# Patient Record
Sex: Female | Born: 1935 | Race: White | Hispanic: No | Smoking: Former smoker
Health system: Southern US, Community
[De-identification: ages and names within clinical notes are randomized; demographics above are authoritative.]

## PROBLEM LIST (undated history)

## (undated) DIAGNOSIS — I10 Essential (primary) hypertension: Secondary | ICD-10-CM

## (undated) DIAGNOSIS — I509 Heart failure, unspecified: Secondary | ICD-10-CM

## (undated) DIAGNOSIS — I4891 Unspecified atrial fibrillation: Secondary | ICD-10-CM

## (undated) DIAGNOSIS — J449 Chronic obstructive pulmonary disease, unspecified: Secondary | ICD-10-CM

## (undated) DIAGNOSIS — C4330 Malignant melanoma of unspecified part of face: Secondary | ICD-10-CM

## (undated) DIAGNOSIS — I639 Cerebral infarction, unspecified: Secondary | ICD-10-CM

## (undated) DIAGNOSIS — R0602 Shortness of breath: Secondary | ICD-10-CM

## (undated) HISTORY — PX: SKIN CANCER EXCISION: SHX779

## (undated) HISTORY — PX: TUBAL LIGATION: SHX77

## (undated) HISTORY — PX: CATARACT EXTRACTION: SUR2

---

## 2004-05-20 ENCOUNTER — Other Ambulatory Visit: Payer: Self-pay

## 2004-11-19 ENCOUNTER — Ambulatory Visit: Payer: Self-pay | Admitting: Oncology

## 2005-06-10 ENCOUNTER — Ambulatory Visit: Payer: Self-pay | Admitting: Oncology

## 2005-06-18 ENCOUNTER — Ambulatory Visit: Payer: Self-pay | Admitting: Oncology

## 2005-07-21 ENCOUNTER — Ambulatory Visit: Payer: Self-pay | Admitting: Internal Medicine

## 2005-12-22 ENCOUNTER — Ambulatory Visit: Payer: Self-pay | Admitting: Oncology

## 2006-05-05 ENCOUNTER — Ambulatory Visit: Payer: Self-pay | Admitting: Family Medicine

## 2006-05-13 ENCOUNTER — Ambulatory Visit: Payer: Self-pay | Admitting: Oncology

## 2006-05-18 ENCOUNTER — Ambulatory Visit: Payer: Self-pay | Admitting: Oncology

## 2006-05-25 ENCOUNTER — Ambulatory Visit: Payer: Self-pay | Admitting: Family Medicine

## 2006-11-14 ENCOUNTER — Ambulatory Visit: Payer: Self-pay | Admitting: Oncology

## 2006-12-13 ENCOUNTER — Ambulatory Visit: Payer: Self-pay | Admitting: Oncology

## 2007-01-08 ENCOUNTER — Emergency Department: Payer: Self-pay | Admitting: Internal Medicine

## 2007-01-20 ENCOUNTER — Other Ambulatory Visit: Payer: Self-pay

## 2007-01-20 ENCOUNTER — Inpatient Hospital Stay: Payer: Self-pay | Admitting: Internal Medicine

## 2007-05-14 ENCOUNTER — Ambulatory Visit: Payer: Self-pay | Admitting: Oncology

## 2007-05-17 ENCOUNTER — Ambulatory Visit: Payer: Self-pay | Admitting: Oncology

## 2007-06-13 ENCOUNTER — Ambulatory Visit: Payer: Self-pay | Admitting: Oncology

## 2007-11-13 ENCOUNTER — Ambulatory Visit: Payer: Self-pay | Admitting: Oncology

## 2007-11-15 ENCOUNTER — Ambulatory Visit: Payer: Self-pay | Admitting: Oncology

## 2007-12-14 ENCOUNTER — Ambulatory Visit: Payer: Self-pay | Admitting: Oncology

## 2008-02-02 ENCOUNTER — Ambulatory Visit: Payer: Self-pay | Admitting: Oncology

## 2008-02-25 ENCOUNTER — Emergency Department: Payer: Self-pay | Admitting: Internal Medicine

## 2008-05-13 ENCOUNTER — Ambulatory Visit: Payer: Self-pay | Admitting: Oncology

## 2008-05-15 ENCOUNTER — Inpatient Hospital Stay: Payer: Self-pay | Admitting: Internal Medicine

## 2008-05-15 ENCOUNTER — Other Ambulatory Visit: Payer: Self-pay

## 2008-05-24 ENCOUNTER — Other Ambulatory Visit: Payer: Self-pay

## 2008-06-10 ENCOUNTER — Encounter: Payer: Self-pay | Admitting: Internal Medicine

## 2008-06-12 ENCOUNTER — Ambulatory Visit: Payer: Self-pay | Admitting: Oncology

## 2008-06-12 ENCOUNTER — Encounter: Payer: Self-pay | Admitting: Internal Medicine

## 2008-07-10 ENCOUNTER — Ambulatory Visit: Payer: Self-pay | Admitting: Oncology

## 2008-07-13 ENCOUNTER — Encounter: Payer: Self-pay | Admitting: Internal Medicine

## 2008-07-13 ENCOUNTER — Ambulatory Visit: Payer: Self-pay | Admitting: Oncology

## 2008-08-20 ENCOUNTER — Ambulatory Visit: Payer: Self-pay | Admitting: Oncology

## 2008-08-23 ENCOUNTER — Ambulatory Visit: Payer: Self-pay | Admitting: Oncology

## 2008-09-12 ENCOUNTER — Ambulatory Visit: Payer: Self-pay | Admitting: Oncology

## 2008-09-24 ENCOUNTER — Ambulatory Visit: Payer: Self-pay | Admitting: Family Medicine

## 2008-12-13 ENCOUNTER — Ambulatory Visit: Payer: Self-pay | Admitting: Oncology

## 2008-12-23 ENCOUNTER — Ambulatory Visit: Payer: Self-pay | Admitting: Oncology

## 2008-12-27 ENCOUNTER — Ambulatory Visit: Payer: Self-pay | Admitting: Oncology

## 2009-01-13 ENCOUNTER — Ambulatory Visit: Payer: Self-pay | Admitting: Oncology

## 2009-02-21 ENCOUNTER — Ambulatory Visit: Payer: Self-pay | Admitting: Family Medicine

## 2009-05-29 IMAGING — CT CT CHEST W/ CM
2 series · 15 of 31 positions shown, 19 images · IV contrast (agent unspecified)
Comparison: none

REASON FOR EXAM: INCLUDE ADRENALS    HX of lung CA
COMMENTS:

PROCEDURE:     CT  - CT CHEST WITH CONTRAST  - December 23, 2008 [DATE]
RESULT:     Comparison: 08/20/2008
INDICATION: History of lung cancer
TECHNIQUE: Multiple axial images of the chest are obtained with 100 ml of
Gsovue-I92 intravenous contrast.

[Series 2: soft tissue · axial · 0.68mm/px · z∈[-110,-50]mm · 2 of 75 slices shown]
[im 6/75  mediastinal]
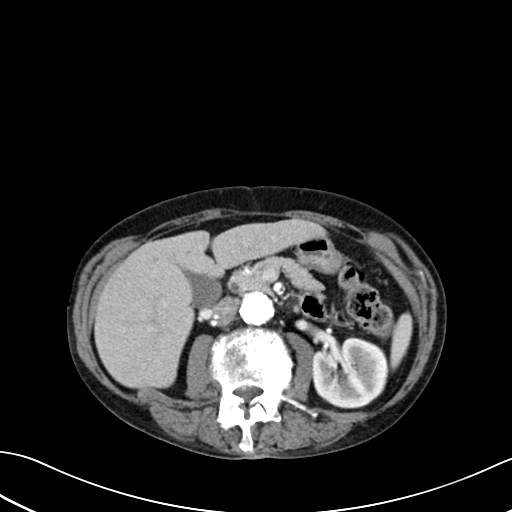
[im 18/75  mediastinal]
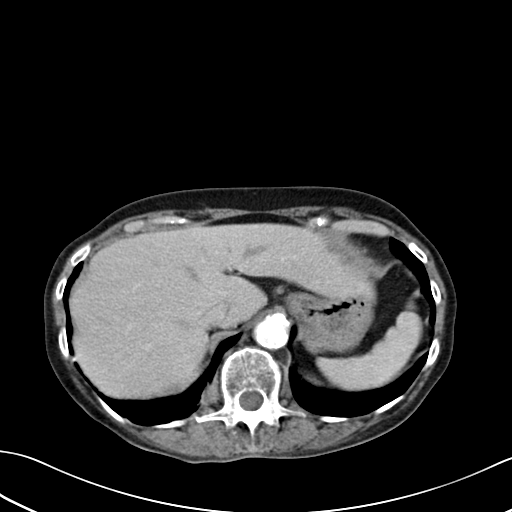

[Series 3: lung windows · axial · 0.68mm/px · z∈[-100,+204]mm · 13 of 73 slices shown, 17 images]
[im 6/73  mediastinal]
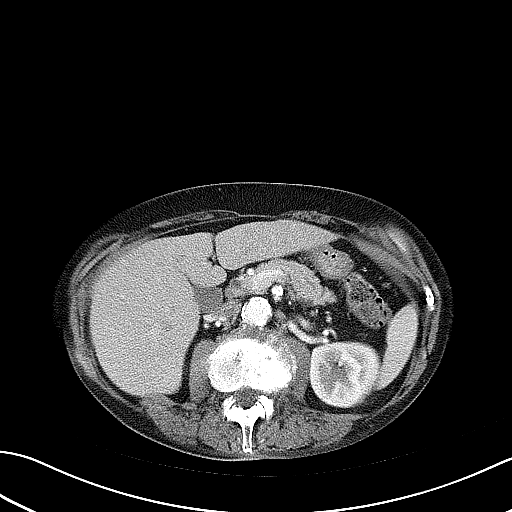
[im 6/73  lung]
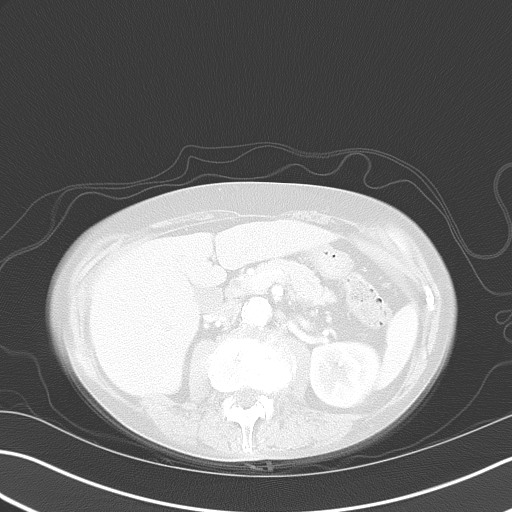
[im 12/73  lung]
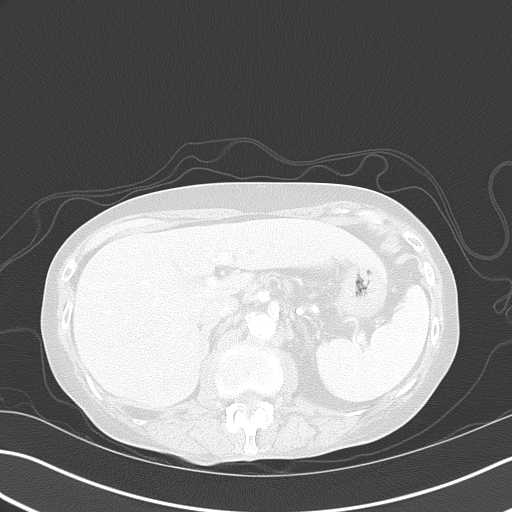
[im 17/73  lung]
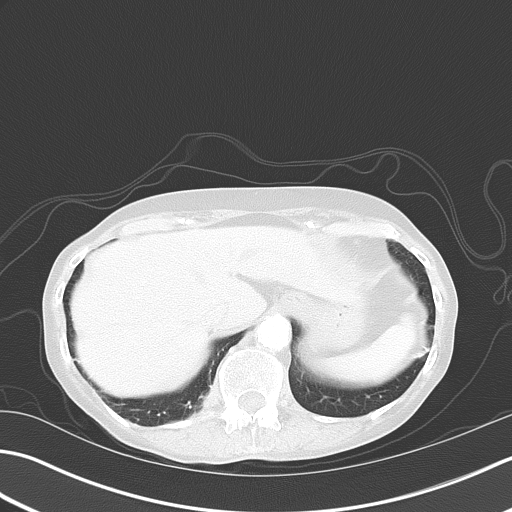
[im 23/73  lung]
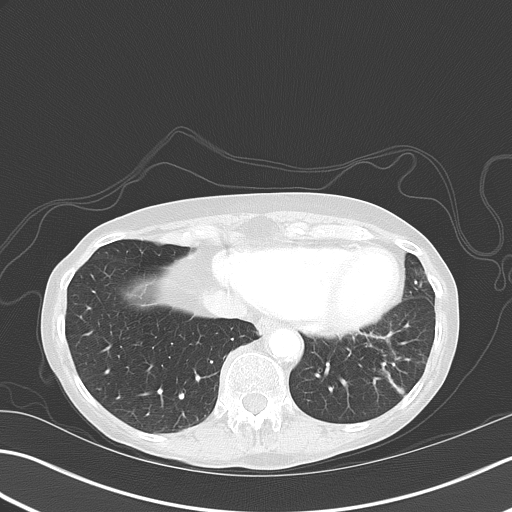
[im 28/73  mediastinal]
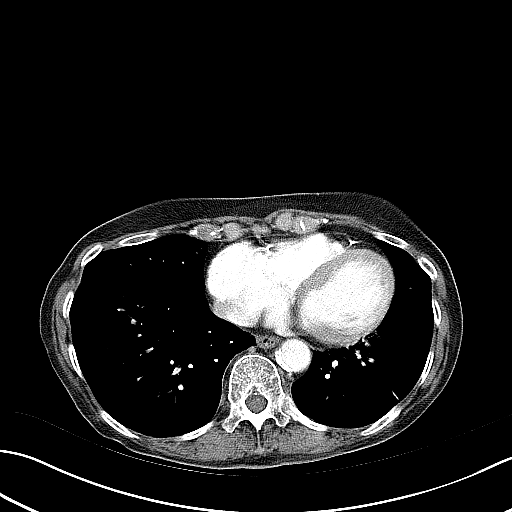
[im 28/73  lung]
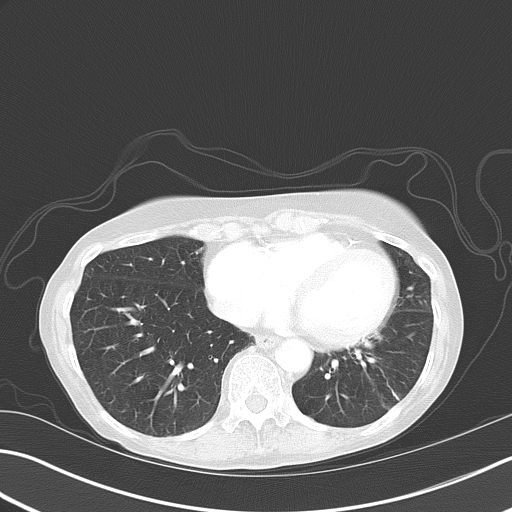
[im 34/73  lung]
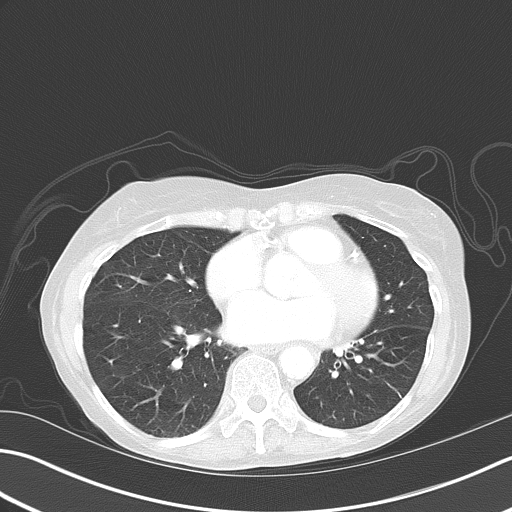
[im 37/73  lung]
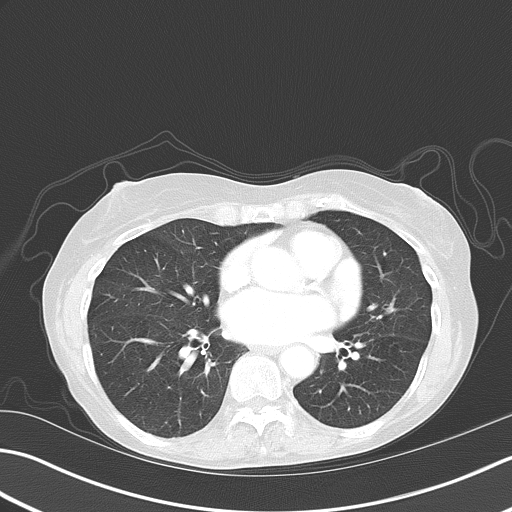
[im 39/73  lung]
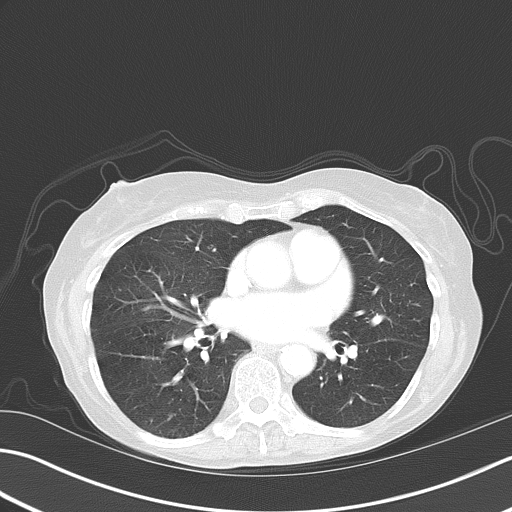
[im 45/73  mediastinal]
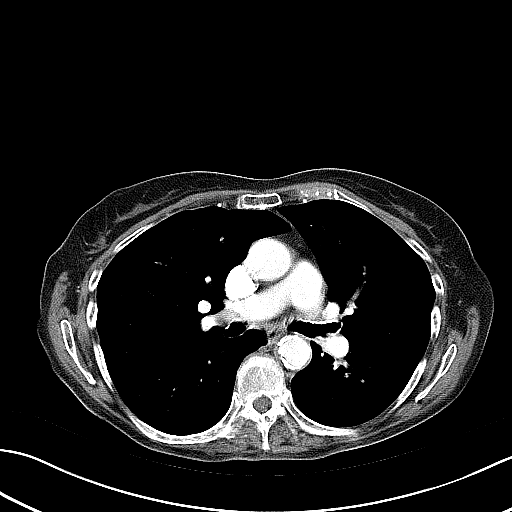
[im 45/73  lung]
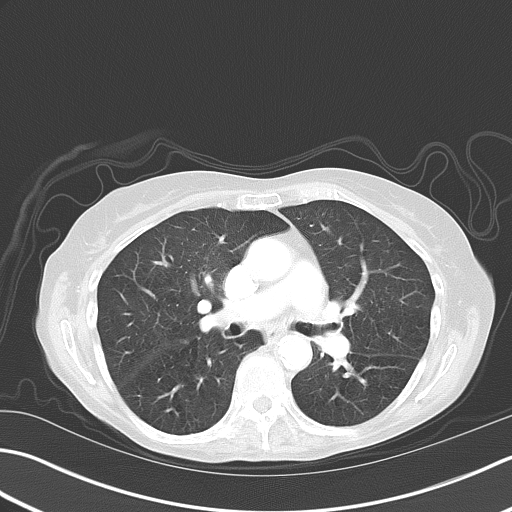
[im 50/73  lung]
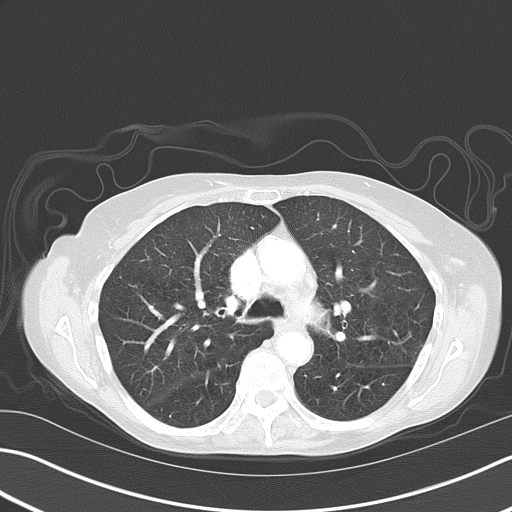
[im 56/73  lung]
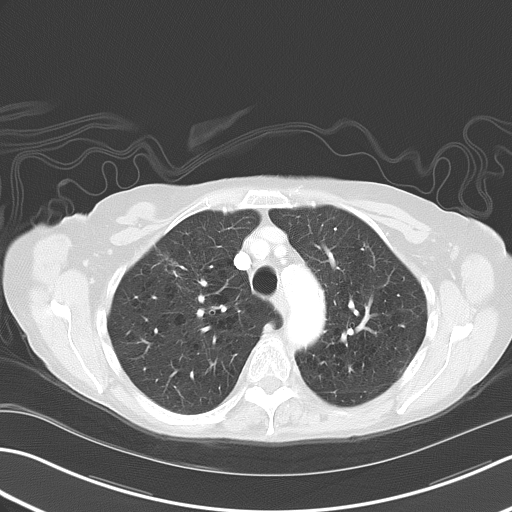
[im 61/73  lung]
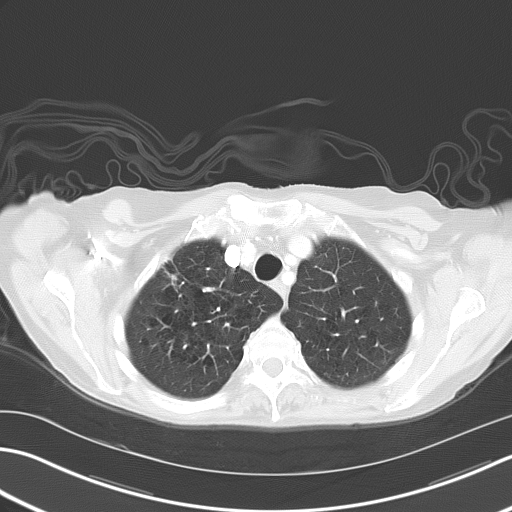
[im 67/73  mediastinal]
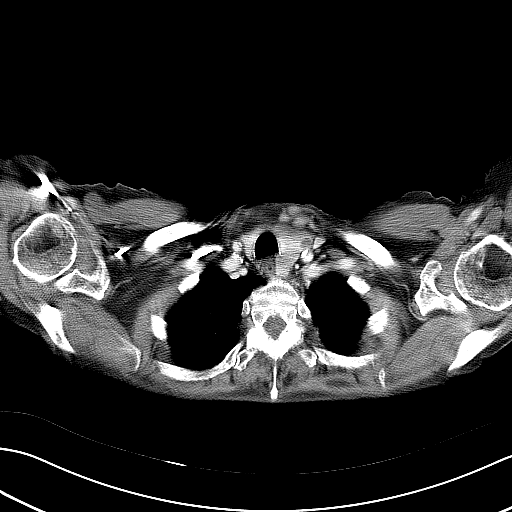
[im 67/73  lung]
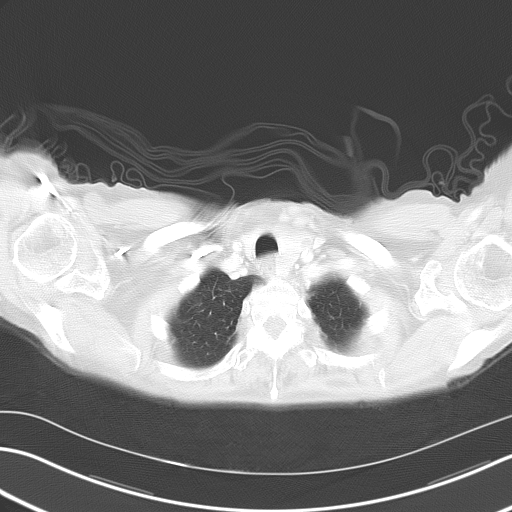

[15 of 31 positions shown; findings below may reference images not displayed]

FINDINGS: The central airways are patent. Again demonstrated is a linear density in
the right upper lobe most consistent with fibrosis. This is unchanged
compared to the prior exam. There is no focal pulmonary mass or nodule.
There is a stable irregular linear opacity measuring less than 3 mm in the
left upper lobe. There is linear airspace disease at the left lung base like
to presenting scarring. There are bilateral emphysematous changes
predominantly in the upper lobes. There is no pleural effusion or
pneumothorax.

There are no pathologically enlarged axillary, hilar, or mediastinal lymph
nodes.

The heart size is normal. There is no pericardial effusion. The thoracic
aorta is normal in caliber.  Coronary artery calcifications are noted..

Review of bone windows demonstrates no focal lytic or sclerotic lesions.

Limited noncontrast images of the upper abdomen were obtained. There is a
1.2 x 1.3 cm left adrenal nodule which appears unchanged from the prior exam.
IMPRESSION: 1. No evidence of a discrete pulmonary mass.

2. Stable left adrenal nodule.

## 2009-10-13 ENCOUNTER — Ambulatory Visit: Payer: Self-pay | Admitting: Oncology

## 2009-10-15 ENCOUNTER — Ambulatory Visit: Payer: Self-pay | Admitting: Oncology

## 2009-11-05 ENCOUNTER — Ambulatory Visit: Payer: Self-pay | Admitting: Family Medicine

## 2009-11-12 ENCOUNTER — Ambulatory Visit: Payer: Self-pay | Admitting: Oncology

## 2010-12-02 ENCOUNTER — Ambulatory Visit: Payer: Self-pay | Admitting: Family Medicine

## 2011-04-19 ENCOUNTER — Emergency Department: Payer: Self-pay | Admitting: Emergency Medicine

## 2011-05-13 ENCOUNTER — Inpatient Hospital Stay: Payer: Self-pay | Admitting: Internal Medicine

## 2011-06-17 ENCOUNTER — Ambulatory Visit: Payer: Self-pay | Admitting: Vascular Surgery

## 2011-07-08 ENCOUNTER — Ambulatory Visit: Payer: Self-pay | Admitting: Internal Medicine

## 2011-07-23 ENCOUNTER — Inpatient Hospital Stay: Payer: Self-pay | Admitting: Vascular Surgery

## 2011-10-17 IMAGING — US US CAROTID DUPLEX BILAT
1 series · 14 of 24 positions shown · non-contrast
Comparison: none

REASON FOR EXAM: COMMENTS:

[Series 1: us carotid duplex bilat · 0.06mm/px · 14 of 59 slices shown]
[im 1/59]
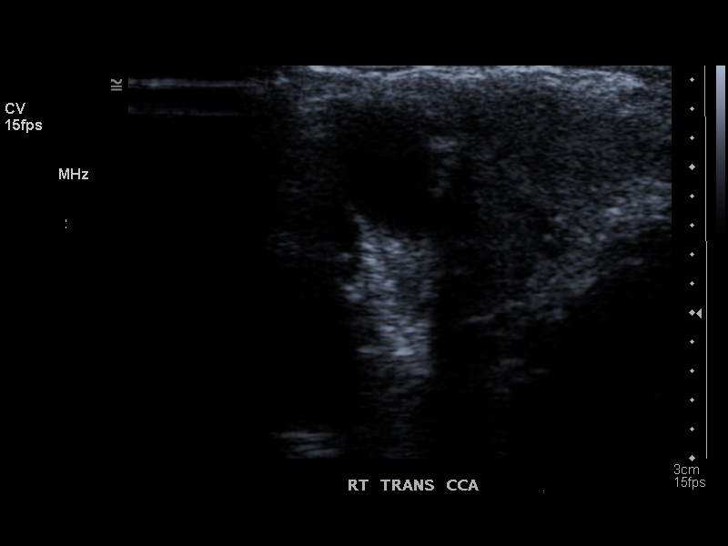
[im 6/59]
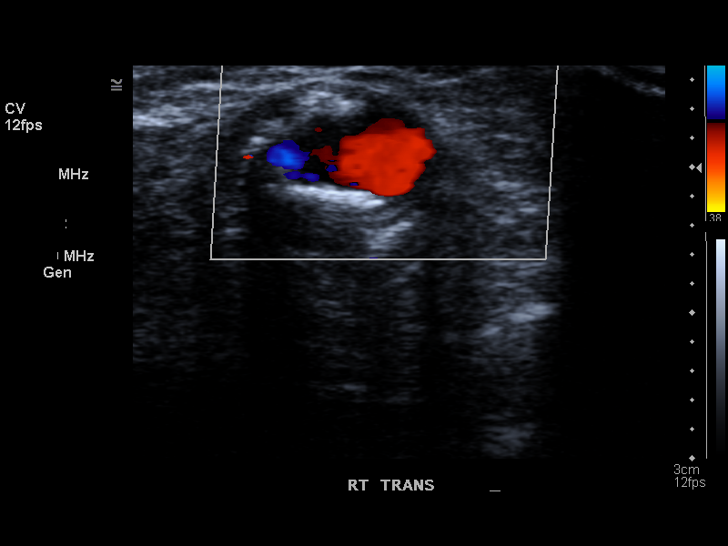
[im 11/59]
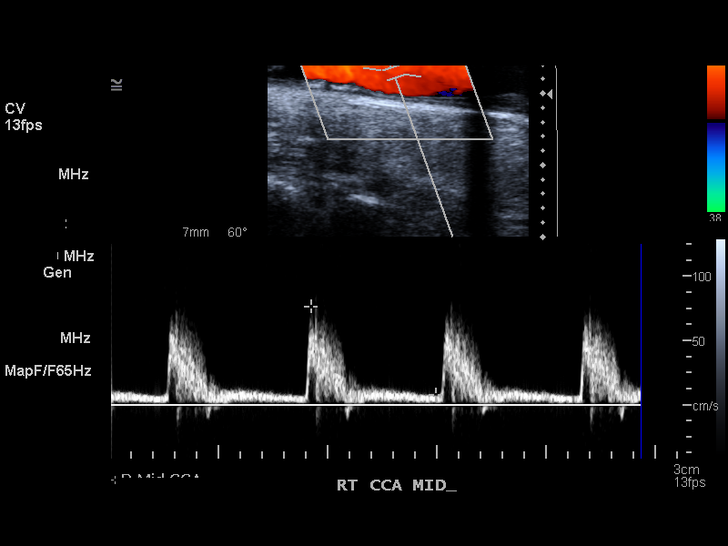
[im 16/59]
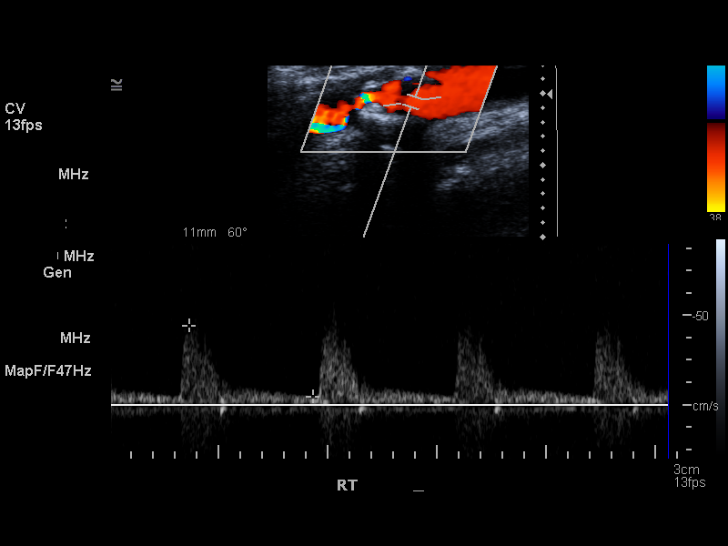
[im 18/59]
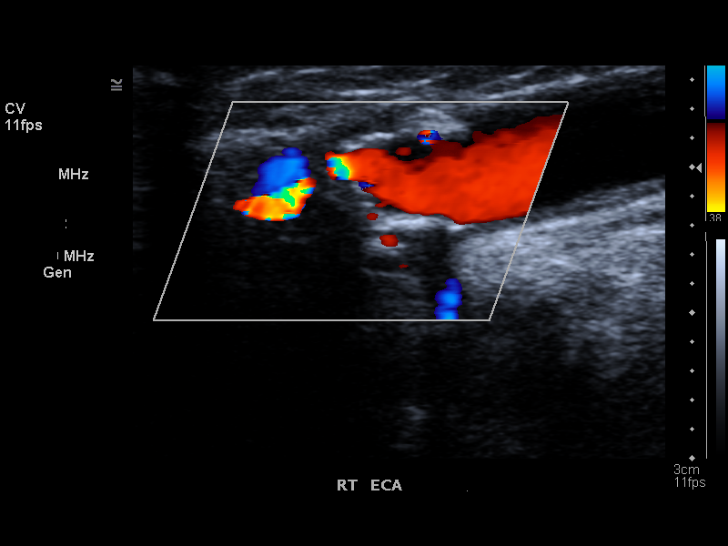
[im 23/59]
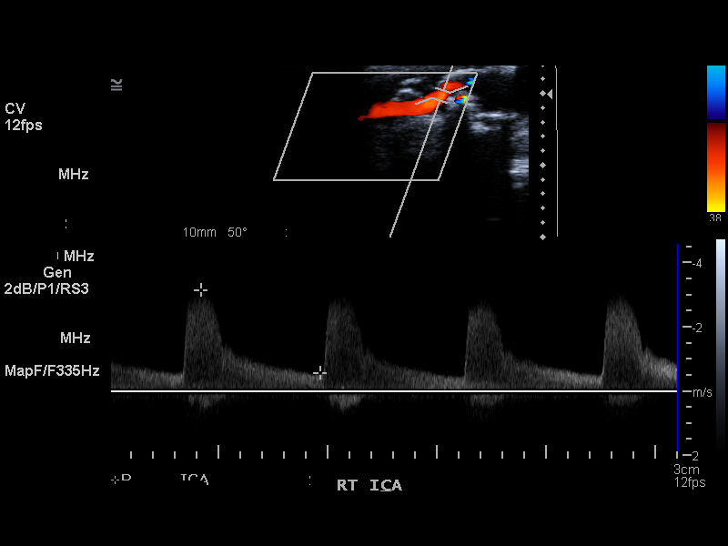
[im 28/59]
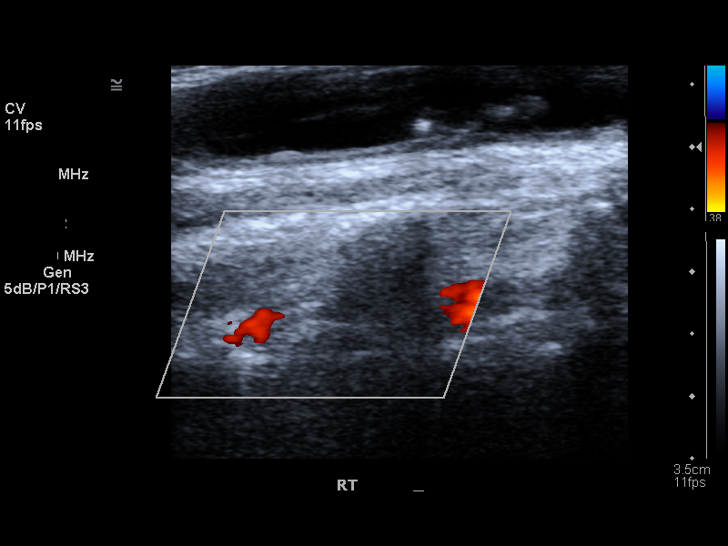
[im 31/59]
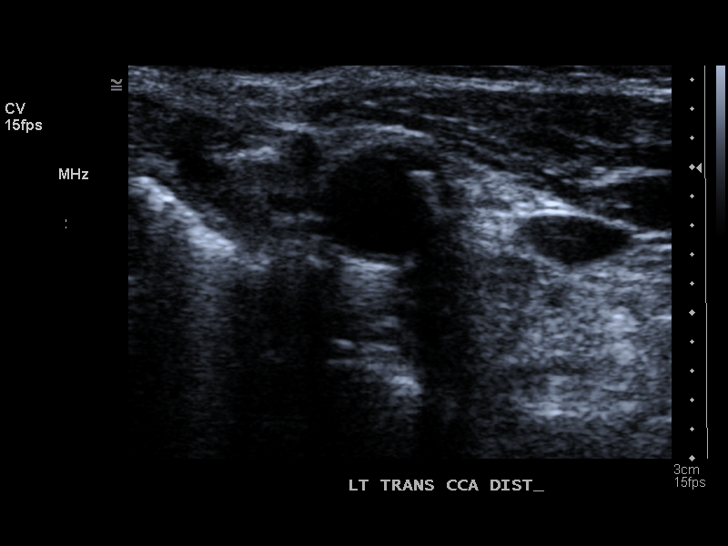
[im 36/59]
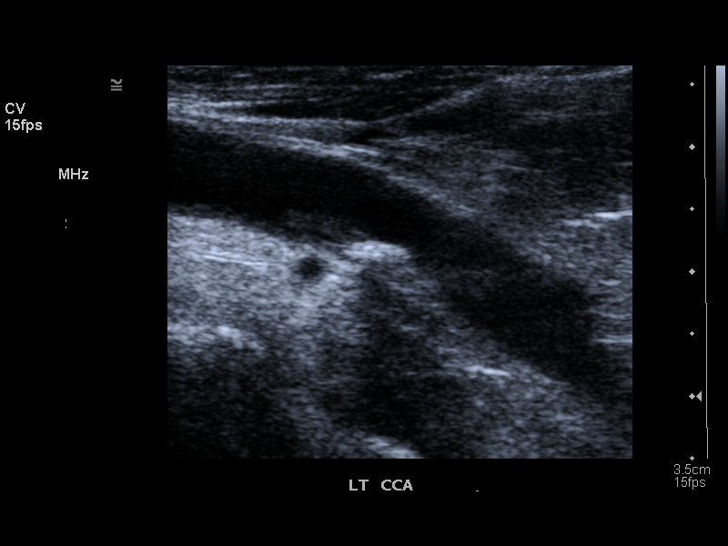
[im 41/59]
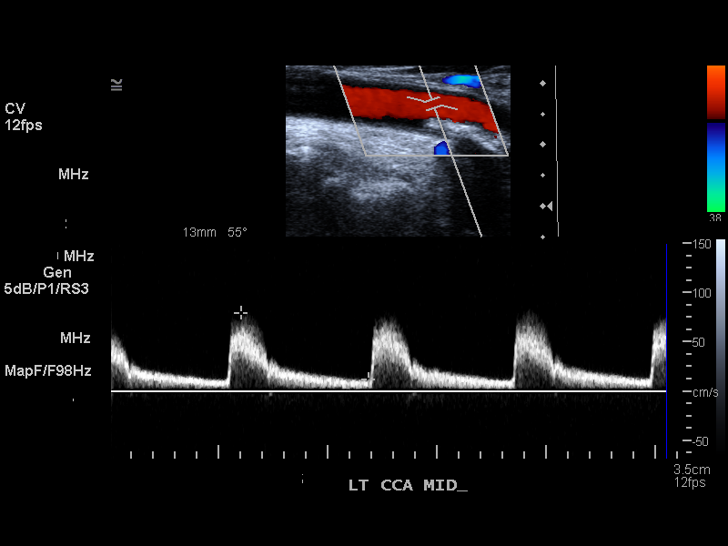
[im 46/59]
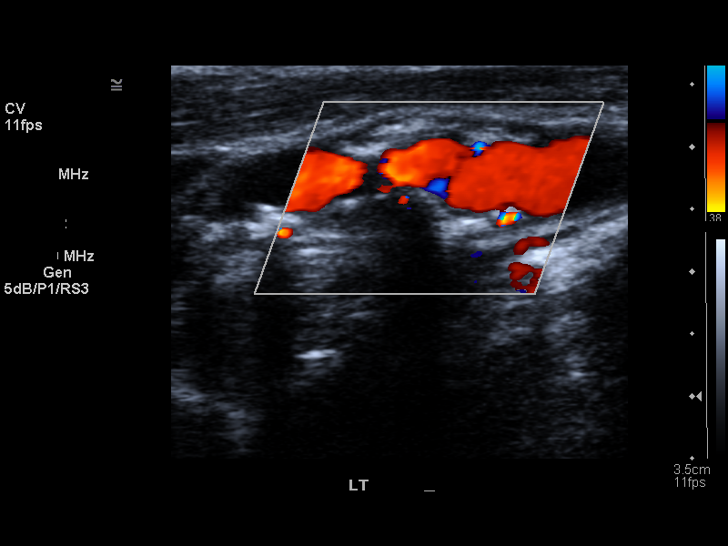
[im 48/59]
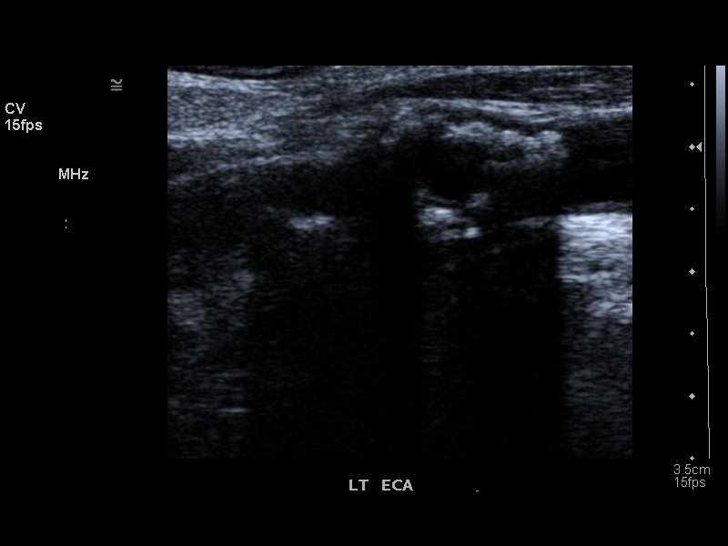
[im 53/59]
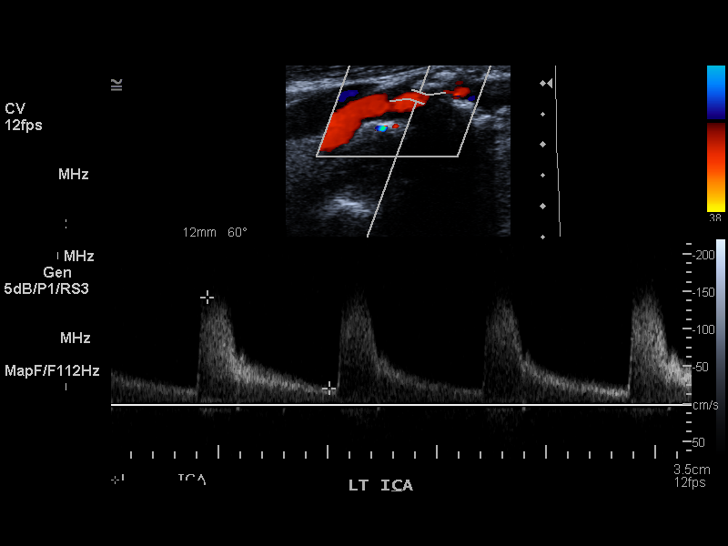
[im 59/59]
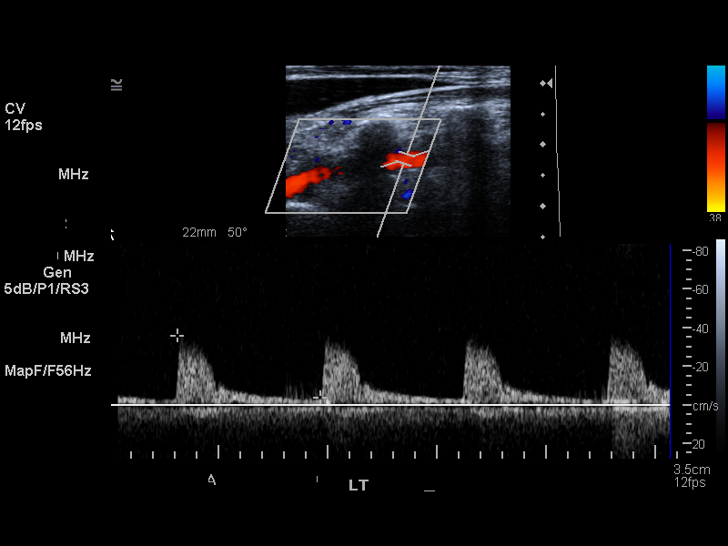

[14 of 24 positions shown; findings below may reference images not displayed]

PROCEDURE:     US  - US CAROTID DOPPLER BILATERAL  - May 13, 2011 [DATE]

RESULT:      Standard bilateral carotid color duplex Doppler reveals
bilateral dense calcific stenoses with peak systolic flow velocity ratio on
the right with degree of stenosis up to 95% and peak systolic flow velocity
ratio on the left of 1.8 with degree of stenosis of 50 to 75%.
IMPRESSION: Severe bilateral carotid disease with near occlusion on the right. CTA
should be considered for further evaluation.

Addendum: Vertebrals are patent with antegrade flow.

## 2012-10-18 ENCOUNTER — Ambulatory Visit: Payer: Self-pay | Admitting: Ophthalmology

## 2012-10-18 DIAGNOSIS — I10 Essential (primary) hypertension: Secondary | ICD-10-CM

## 2012-10-30 ENCOUNTER — Ambulatory Visit: Payer: Self-pay | Admitting: Ophthalmology

## 2012-11-22 ENCOUNTER — Ambulatory Visit: Payer: Self-pay | Admitting: Ophthalmology

## 2012-11-22 LAB — POTASSIUM: Potassium: 4.1 mmol/L (ref 3.5–5.1)

## 2012-11-28 ENCOUNTER — Ambulatory Visit: Payer: Self-pay | Admitting: Ophthalmology

## 2013-05-04 ENCOUNTER — Ambulatory Visit: Payer: Self-pay | Admitting: Surgery

## 2013-05-04 LAB — PROTIME-INR
INR: 1.9
Prothrombin Time: 21.2 secs — ABNORMAL HIGH (ref 11.5–14.7)

## 2013-05-08 LAB — WOUND CULTURE

## 2013-05-09 ENCOUNTER — Encounter: Payer: Self-pay | Admitting: Cardiothoracic Surgery

## 2013-05-09 ENCOUNTER — Encounter: Payer: Self-pay | Admitting: Nurse Practitioner

## 2013-05-13 ENCOUNTER — Encounter: Payer: Self-pay | Admitting: Cardiothoracic Surgery

## 2013-05-13 ENCOUNTER — Encounter: Payer: Self-pay | Admitting: Nurse Practitioner

## 2013-06-12 ENCOUNTER — Encounter: Payer: Self-pay | Admitting: Nurse Practitioner

## 2013-06-12 ENCOUNTER — Encounter: Payer: Self-pay | Admitting: Cardiothoracic Surgery

## 2013-07-13 ENCOUNTER — Encounter: Payer: Self-pay | Admitting: Cardiothoracic Surgery

## 2013-07-13 ENCOUNTER — Encounter: Payer: Self-pay | Admitting: Nurse Practitioner

## 2013-11-29 ENCOUNTER — Inpatient Hospital Stay: Payer: Self-pay | Admitting: Specialist

## 2013-11-29 LAB — COMPREHENSIVE METABOLIC PANEL
Alkaline Phosphatase: 82 U/L
Anion Gap: 6 — ABNORMAL LOW (ref 7–16)
BUN: 18 mg/dL (ref 7–18)
Bilirubin,Total: 0.5 mg/dL (ref 0.2–1.0)
Calcium, Total: 8.3 mg/dL — ABNORMAL LOW (ref 8.5–10.1)
Creatinine: 1.33 mg/dL — ABNORMAL HIGH (ref 0.60–1.30)
EGFR (African American): 44 — ABNORMAL LOW
EGFR (Non-African Amer.): 38 — ABNORMAL LOW
Glucose: 147 mg/dL — ABNORMAL HIGH (ref 65–99)
Osmolality: 280 (ref 275–301)
SGOT(AST): 193 U/L — ABNORMAL HIGH (ref 15–37)
Sodium: 138 mmol/L (ref 136–145)
Total Protein: 7.2 g/dL (ref 6.4–8.2)

## 2013-11-29 LAB — CBC WITH DIFFERENTIAL/PLATELET
Basophil %: 0.4 %
Eosinophil %: 0 %
HGB: 11.7 g/dL — ABNORMAL LOW (ref 12.0–16.0)
Lymphocyte #: 1.1 10*3/uL (ref 1.0–3.6)
MCHC: 31 g/dL — ABNORMAL LOW (ref 32.0–36.0)
MCV: 74 fL — ABNORMAL LOW (ref 80–100)
Monocyte #: 0.9 x10 3/mm (ref 0.2–0.9)
Monocyte %: 6.2 %
Neutrophil #: 12.8 10*3/uL — ABNORMAL HIGH (ref 1.4–6.5)
Neutrophil %: 86.3 %
Platelet: 111 10*3/uL — ABNORMAL LOW (ref 150–440)
RBC: 5.13 10*6/uL (ref 3.80–5.20)
WBC: 14.9 10*3/uL — ABNORMAL HIGH (ref 3.6–11.0)

## 2013-11-29 LAB — PROTIME-INR: INR: 1.3

## 2013-11-29 LAB — URINALYSIS, COMPLETE
Blood: NEGATIVE
Glucose,UR: NEGATIVE mg/dL (ref 0–75)
Leukocyte Esterase: NEGATIVE
Ph: 5 (ref 4.5–8.0)
Protein: NEGATIVE
RBC,UR: NONE SEEN /HPF (ref 0–5)

## 2013-11-29 LAB — TSH: Thyroid Stimulating Horm: 0.45 u[IU]/mL

## 2013-11-29 LAB — CK TOTAL AND CKMB (NOT AT ARMC)
CK, Total: 141 U/L (ref 21–215)
CK-MB: 0.6 ng/mL (ref 0.5–3.6)

## 2013-11-29 LAB — TROPONIN I: Troponin-I: 0.09 ng/mL — ABNORMAL HIGH

## 2013-11-30 LAB — COMPREHENSIVE METABOLIC PANEL
Alkaline Phosphatase: 59 U/L
Anion Gap: 7 (ref 7–16)
BUN: 17 mg/dL (ref 7–18)
Calcium, Total: 7.2 mg/dL — ABNORMAL LOW (ref 8.5–10.1)
Chloride: 110 mmol/L — ABNORMAL HIGH (ref 98–107)
Co2: 23 mmol/L (ref 21–32)
Creatinine: 1.3 mg/dL (ref 0.60–1.30)
EGFR (African American): 45 — ABNORMAL LOW
EGFR (Non-African Amer.): 39 — ABNORMAL LOW
Glucose: 111 mg/dL — ABNORMAL HIGH (ref 65–99)
Osmolality: 282 (ref 275–301)
Potassium: 3.4 mmol/L — ABNORMAL LOW (ref 3.5–5.1)
SGOT(AST): 162 U/L — ABNORMAL HIGH (ref 15–37)

## 2013-11-30 LAB — CBC WITH DIFFERENTIAL/PLATELET
Bands: 22 %
HCT: 32.7 % — ABNORMAL LOW (ref 35.0–47.0)
Lymphocytes: 19 %
MCH: 22.6 pg — ABNORMAL LOW (ref 26.0–34.0)
MCHC: 31.1 g/dL — ABNORMAL LOW (ref 32.0–36.0)
WBC: 12.1 10*3/uL — ABNORMAL HIGH (ref 3.6–11.0)

## 2013-11-30 LAB — TROPONIN I
Troponin-I: 0.11 ng/mL — ABNORMAL HIGH
Troponin-I: 0.1 ng/mL — ABNORMAL HIGH
Troponin-I: 0.1 ng/mL — ABNORMAL HIGH

## 2013-11-30 LAB — PROTIME-INR
INR: 1.5
Prothrombin Time: 18.1 secs — ABNORMAL HIGH (ref 11.5–14.7)

## 2013-11-30 LAB — CK TOTAL AND CKMB (NOT AT ARMC): CK-MB: 1.4 ng/mL (ref 0.5–3.6)

## 2013-12-01 LAB — URINE CULTURE

## 2013-12-02 LAB — COMPREHENSIVE METABOLIC PANEL
Albumin: 2.3 g/dL — ABNORMAL LOW (ref 3.4–5.0)
Bilirubin,Total: 0.5 mg/dL (ref 0.2–1.0)
Chloride: 110 mmol/L — ABNORMAL HIGH (ref 98–107)
Co2: 20 mmol/L — ABNORMAL LOW (ref 21–32)
EGFR (Non-African Amer.): 52 — ABNORMAL LOW
Glucose: 126 mg/dL — ABNORMAL HIGH (ref 65–99)
Osmolality: 281 (ref 275–301)
Potassium: 4.2 mmol/L (ref 3.5–5.1)
SGOT(AST): 81 U/L — ABNORMAL HIGH (ref 15–37)
Sodium: 137 mmol/L (ref 136–145)
Total Protein: 6.1 g/dL — ABNORMAL LOW (ref 6.4–8.2)

## 2013-12-02 LAB — PROTIME-INR: INR: 1.7

## 2013-12-03 LAB — BASIC METABOLIC PANEL
Anion Gap: 4 — ABNORMAL LOW (ref 7–16)
BUN: 33 mg/dL — ABNORMAL HIGH (ref 7–18)
Chloride: 110 mmol/L — ABNORMAL HIGH (ref 98–107)
Co2: 23 mmol/L (ref 21–32)
Creatinine: 0.94 mg/dL (ref 0.60–1.30)
EGFR (African American): >60
Glucose: 142 mg/dL — ABNORMAL HIGH (ref 65–99)
Osmolality: 283 (ref 275–301)

## 2013-12-03 LAB — CBC WITH DIFFERENTIAL/PLATELET
Basophil #: 0 10*3/uL (ref 0.0–0.1)
HCT: 31.9 % — ABNORMAL LOW (ref 35.0–47.0)
HGB: 9.6 g/dL — ABNORMAL LOW (ref 12.0–16.0)
Lymphocyte #: 1.2 10*3/uL (ref 1.0–3.6)
MCH: 22 pg — ABNORMAL LOW (ref 26.0–34.0)
Monocyte %: 5.9 %
Neutrophil %: 85.9 %
Platelet: 83 10*3/uL — ABNORMAL LOW (ref 150–440)
RDW: 25 % — ABNORMAL HIGH (ref 11.5–14.5)
WBC: 14.9 10*3/uL — ABNORMAL HIGH (ref 3.6–11.0)

## 2013-12-04 LAB — PROTIME-INR: Prothrombin Time: 33.7 secs — ABNORMAL HIGH (ref 11.5–14.7)

## 2013-12-04 LAB — BASIC METABOLIC PANEL
Calcium, Total: 7.7 mg/dL — ABNORMAL LOW (ref 8.5–10.1)
Creatinine: 1.04 mg/dL (ref 0.60–1.30)
EGFR (African American): 59 — ABNORMAL LOW
EGFR (Non-African Amer.): 51 — ABNORMAL LOW
Glucose: 142 mg/dL — ABNORMAL HIGH (ref 65–99)
Potassium: 4.7 mmol/L (ref 3.5–5.1)
Sodium: 138 mmol/L (ref 136–145)

## 2013-12-05 LAB — CBC WITH DIFFERENTIAL/PLATELET
Bands: 2 %
HCT: 32.6 % — ABNORMAL LOW (ref 35.0–47.0)
HGB: 10.4 g/dL — ABNORMAL LOW (ref 12.0–16.0)
Lymphocytes: 22 %
MCH: 23.3 pg — ABNORMAL LOW (ref 26.0–34.0)
MCV: 73 fL — ABNORMAL LOW (ref 80–100)
NRBC/100 WBC: 2 /
RBC: 4.45 10*6/uL (ref 3.80–5.20)
Segmented Neutrophils: 59 %
Variant Lymphocyte - H1-Rlymph: 2 %

## 2013-12-05 LAB — BASIC METABOLIC PANEL
Anion Gap: 6 — ABNORMAL LOW (ref 7–16)
BUN: 29 mg/dL — ABNORMAL HIGH (ref 7–18)
Calcium, Total: 7.6 mg/dL — ABNORMAL LOW (ref 8.5–10.1)
Chloride: 111 mmol/L — ABNORMAL HIGH (ref 98–107)
EGFR (Non-African Amer.): 53 — ABNORMAL LOW
Osmolality: 289 (ref 275–301)
Potassium: 4.5 mmol/L (ref 3.5–5.1)

## 2013-12-05 LAB — CULTURE, BLOOD (SINGLE)

## 2013-12-05 LAB — PRO B NATRIURETIC PEPTIDE: B-Type Natriuretic Peptide: 17173 pg/mL — ABNORMAL HIGH (ref 0–450)

## 2013-12-05 LAB — PROTIME-INR: INR: 3.3

## 2013-12-06 LAB — CBC WITH DIFFERENTIAL/PLATELET
Lymphocytes: 20 %
MCH: 23.3 pg — ABNORMAL LOW (ref 26.0–34.0)
MCHC: 31.7 g/dL — ABNORMAL LOW (ref 32.0–36.0)
MCV: 73 fL — ABNORMAL LOW (ref 80–100)
Metamyelocyte: 2 %
Monocytes: 9 %
NRBC/100 WBC: 1 /
Platelet: 147 10*3/uL — ABNORMAL LOW (ref 150–440)
RDW: 24.1 % — ABNORMAL HIGH (ref 11.5–14.5)

## 2013-12-06 LAB — PROTIME-INR
INR: 2.6
Prothrombin Time: 27.1 s — ABNORMAL HIGH

## 2013-12-06 LAB — BASIC METABOLIC PANEL
BUN: 24 mg/dL — ABNORMAL HIGH (ref 7–18)
Calcium, Total: 8.1 mg/dL — ABNORMAL LOW (ref 8.5–10.1)
Co2: 30 mmol/L (ref 21–32)
Creatinine: 0.97 mg/dL (ref 0.60–1.30)
Potassium: 3.9 mmol/L (ref 3.5–5.1)
Sodium: 139 mmol/L (ref 136–145)

## 2013-12-07 LAB — PROTIME-INR
INR: 2
Prothrombin Time: 22.5 secs — ABNORMAL HIGH (ref 11.5–14.7)

## 2013-12-08 LAB — BASIC METABOLIC PANEL
Anion Gap: 2 — ABNORMAL LOW (ref 7–16)
Creatinine: 0.91 mg/dL (ref 0.60–1.30)
EGFR (African American): >60
EGFR (Non-African Amer.): >60
Sodium: 138 mmol/L (ref 136–145)

## 2013-12-08 LAB — CBC WITH DIFFERENTIAL/PLATELET
Bands: 3 %
Comment - H1-Com7: NORMAL
Eosinophil: 1 %
HGB: 9.8 g/dL — ABNORMAL LOW (ref 12.0–16.0)
MCH: 23.3 pg — ABNORMAL LOW (ref 26.0–34.0)
MCHC: 31.6 g/dL — ABNORMAL LOW (ref 32.0–36.0)
MCV: 74 fL — ABNORMAL LOW (ref 80–100)
Metamyelocyte: 3 %
Monocytes: 10 %
RBC: 4.19 10*6/uL (ref 3.80–5.20)
RDW: 23.2 % — ABNORMAL HIGH (ref 11.5–14.5)
WBC: 20 10*3/uL — ABNORMAL HIGH (ref 3.6–11.0)

## 2013-12-08 LAB — PROTIME-INR
INR: 1.6
Prothrombin Time: 19 secs — ABNORMAL HIGH (ref 11.5–14.7)

## 2013-12-09 LAB — WBC: WBC: 20.4 10*3/uL — ABNORMAL HIGH (ref 3.6–11.0)

## 2013-12-09 LAB — PROTIME-INR
INR: 1.5
Prothrombin Time: 18.2 secs — ABNORMAL HIGH (ref 11.5–14.7)

## 2013-12-10 LAB — PROTIME-INR
INR: 1.9
Prothrombin Time: 21.3 secs — ABNORMAL HIGH (ref 11.5–14.7)

## 2013-12-11 LAB — WBC: WBC: 18.7 10*3/uL — ABNORMAL HIGH (ref 3.6–11.0)

## 2013-12-12 LAB — PROTIME-INR
INR: 2.1
Prothrombin Time: 23 secs — ABNORMAL HIGH (ref 11.5–14.7)

## 2013-12-12 LAB — WBC: WBC: 20.7 10*3/uL — ABNORMAL HIGH (ref 3.6–11.0)

## 2013-12-13 ENCOUNTER — Ambulatory Visit: Payer: Self-pay | Admitting: Internal Medicine

## 2013-12-13 LAB — BASIC METABOLIC PANEL
Anion Gap: 4 — ABNORMAL LOW (ref 7–16)
BUN: 17 mg/dL (ref 7–18)
Calcium, Total: 7.3 mg/dL — ABNORMAL LOW (ref 8.5–10.1)
Chloride: 91 mmol/L — ABNORMAL LOW (ref 98–107)
Co2: 42 mmol/L (ref 21–32)
Creatinine: 0.92 mg/dL (ref 0.60–1.30)
EGFR (African American): >60
EGFR (Non-African Amer.): 59 — ABNORMAL LOW
Glucose: 84 mg/dL (ref 65–99)
Osmolality: 275 (ref 275–301)
Potassium: 3.4 mmol/L — ABNORMAL LOW (ref 3.5–5.1)
Sodium: 137 mmol/L (ref 136–145)

## 2013-12-13 LAB — MAGNESIUM: Magnesium: 1.9 mg/dL

## 2013-12-13 LAB — PROTIME-INR
INR: 1.7
Prothrombin Time: 19.4 secs — ABNORMAL HIGH (ref 11.5–14.7)

## 2013-12-13 LAB — WBC: WBC: 21.3 10*3/uL — ABNORMAL HIGH (ref 3.6–11.0)

## 2013-12-14 LAB — BASIC METABOLIC PANEL
Anion Gap: 3 — ABNORMAL LOW (ref 7–16)
BUN: 16 mg/dL (ref 7–18)
Calcium, Total: 7.8 mg/dL — ABNORMAL LOW (ref 8.5–10.1)
Chloride: 94 mmol/L — ABNORMAL LOW (ref 98–107)
Co2: 39 mmol/L — ABNORMAL HIGH (ref 21–32)
Creatinine: 0.76 mg/dL (ref 0.60–1.30)
EGFR (African American): >60
EGFR (Non-African Amer.): >60
Glucose: 100 mg/dL — ABNORMAL HIGH (ref 65–99)
Osmolality: 273 (ref 275–301)
Potassium: 4 mmol/L (ref 3.5–5.1)
Sodium: 136 mmol/L (ref 136–145)

## 2013-12-14 LAB — PROTIME-INR
INR: 1.5
INR: 1.4
Prothrombin Time: 17.4 secs — ABNORMAL HIGH (ref 11.5–14.7)
Prothrombin Time: 17.2 secs — ABNORMAL HIGH (ref 11.5–14.7)

## 2013-12-14 LAB — WBC: WBC: 18.5 10*3/uL — ABNORMAL HIGH (ref 3.6–11.0)

## 2013-12-15 LAB — PROTIME-INR
INR: 1.4
Prothrombin Time: 16.7 secs — ABNORMAL HIGH (ref 11.5–14.7)

## 2013-12-15 LAB — BASIC METABOLIC PANEL
Anion Gap: 0 — ABNORMAL LOW (ref 7–16)
BUN: 14 mg/dL (ref 7–18)
Calcium, Total: 7.9 mg/dL — ABNORMAL LOW (ref 8.5–10.1)
Chloride: 97 mmol/L — ABNORMAL LOW (ref 98–107)
Co2: 39 mmol/L — ABNORMAL HIGH (ref 21–32)
Creatinine: 0.78 mg/dL (ref 0.60–1.30)
EGFR (African American): >60
EGFR (Non-African Amer.): >60
Glucose: 84 mg/dL (ref 65–99)
Osmolality: 272 (ref 275–301)
Potassium: 4.1 mmol/L (ref 3.5–5.1)
Sodium: 136 mmol/L (ref 136–145)

## 2013-12-17 LAB — PROTIME-INR
INR: 1.1
Prothrombin Time: 13.9 secs (ref 11.5–14.7)

## 2013-12-17 LAB — PROTEIN, BODY FLUID: Protein, Body Fluid: 1 g/dL

## 2013-12-17 LAB — BODY FLUID CELL COUNT WITH DIFFERENTIAL
Basophil: 0 %
EOS PCT: 0 %
LYMPHS PCT: 22 %
Neutrophils: 49 %
Nucleated Cell Count: 486 /mm3
Other Cells BF: 0 %
Other Mononuclear Cells: 29 %

## 2013-12-17 LAB — LACTATE DEHYDROGENASE, PLEURAL OR PERITONEAL FLUID: LDH, Body Fluid: 83 U/L

## 2013-12-17 LAB — GLUCOSE, SEROUS FLUID: GLUCOSE, BODY FLUID: 83 mg/dL

## 2013-12-18 LAB — PROTIME-INR
INR: 1.1
Prothrombin Time: 13.9 secs (ref 11.5–14.7)

## 2014-01-13 ENCOUNTER — Ambulatory Visit: Payer: Self-pay | Admitting: Internal Medicine

## 2014-03-19 ENCOUNTER — Encounter (HOSPITAL_COMMUNITY): Payer: Self-pay | Admitting: Emergency Medicine

## 2014-03-19 ENCOUNTER — Emergency Department (HOSPITAL_COMMUNITY): Payer: Medicare HMO

## 2014-03-19 ENCOUNTER — Inpatient Hospital Stay (HOSPITAL_COMMUNITY)
Admission: EM | Admit: 2014-03-19 | Discharge: 2014-04-12 | DRG: 064 | Disposition: E | Payer: Medicare HMO | Attending: Family Medicine | Admitting: Family Medicine

## 2014-03-19 DIAGNOSIS — E876 Hypokalemia: Secondary | ICD-10-CM | POA: Diagnosis not present

## 2014-03-19 DIAGNOSIS — Z515 Encounter for palliative care: Secondary | ICD-10-CM | POA: Diagnosis present

## 2014-03-19 DIAGNOSIS — I129 Hypertensive chronic kidney disease with stage 1 through stage 4 chronic kidney disease, or unspecified chronic kidney disease: Secondary | ICD-10-CM | POA: Diagnosis present

## 2014-03-19 DIAGNOSIS — Z85820 Personal history of malignant melanoma of skin: Secondary | ICD-10-CM | POA: Diagnosis not present

## 2014-03-19 DIAGNOSIS — I2789 Other specified pulmonary heart diseases: Secondary | ICD-10-CM | POA: Diagnosis present

## 2014-03-19 DIAGNOSIS — I509 Heart failure, unspecified: Secondary | ICD-10-CM | POA: Diagnosis present

## 2014-03-19 DIAGNOSIS — G8190 Hemiplegia, unspecified affecting unspecified side: Secondary | ICD-10-CM | POA: Diagnosis present

## 2014-03-19 DIAGNOSIS — R569 Unspecified convulsions: Secondary | ICD-10-CM | POA: Diagnosis present

## 2014-03-19 DIAGNOSIS — E785 Hyperlipidemia, unspecified: Secondary | ICD-10-CM | POA: Diagnosis present

## 2014-03-19 DIAGNOSIS — J441 Chronic obstructive pulmonary disease with (acute) exacerbation: Secondary | ICD-10-CM | POA: Diagnosis present

## 2014-03-19 DIAGNOSIS — I69993 Ataxia following unspecified cerebrovascular disease: Secondary | ICD-10-CM

## 2014-03-19 DIAGNOSIS — G936 Cerebral edema: Secondary | ICD-10-CM | POA: Diagnosis present

## 2014-03-19 DIAGNOSIS — J154 Pneumonia due to other streptococci: Secondary | ICD-10-CM | POA: Diagnosis present

## 2014-03-19 DIAGNOSIS — I63239 Cerebral infarction due to unspecified occlusion or stenosis of unspecified carotid arteries: Secondary | ICD-10-CM | POA: Diagnosis present

## 2014-03-19 DIAGNOSIS — E870 Hyperosmolality and hypernatremia: Secondary | ICD-10-CM

## 2014-03-19 DIAGNOSIS — I4891 Unspecified atrial fibrillation: Secondary | ICD-10-CM | POA: Diagnosis present

## 2014-03-19 DIAGNOSIS — G319 Degenerative disease of nervous system, unspecified: Secondary | ICD-10-CM | POA: Diagnosis present

## 2014-03-19 DIAGNOSIS — D649 Anemia, unspecified: Secondary | ICD-10-CM | POA: Diagnosis present

## 2014-03-19 DIAGNOSIS — A498 Other bacterial infections of unspecified site: Secondary | ICD-10-CM | POA: Diagnosis not present

## 2014-03-19 DIAGNOSIS — J9819 Other pulmonary collapse: Secondary | ICD-10-CM | POA: Diagnosis present

## 2014-03-19 DIAGNOSIS — R410 Disorientation, unspecified: Secondary | ICD-10-CM

## 2014-03-19 DIAGNOSIS — Z7901 Long term (current) use of anticoagulants: Secondary | ICD-10-CM

## 2014-03-19 DIAGNOSIS — I6340 Cerebral infarction due to embolism of unspecified cerebral artery: Principal | ICD-10-CM | POA: Diagnosis present

## 2014-03-19 DIAGNOSIS — N189 Chronic kidney disease, unspecified: Secondary | ICD-10-CM | POA: Diagnosis present

## 2014-03-19 DIAGNOSIS — N179 Acute kidney failure, unspecified: Secondary | ICD-10-CM | POA: Diagnosis present

## 2014-03-19 DIAGNOSIS — E1169 Type 2 diabetes mellitus with other specified complication: Secondary | ICD-10-CM | POA: Diagnosis present

## 2014-03-19 DIAGNOSIS — D696 Thrombocytopenia, unspecified: Secondary | ICD-10-CM | POA: Diagnosis present

## 2014-03-19 DIAGNOSIS — R1310 Dysphagia, unspecified: Secondary | ICD-10-CM | POA: Diagnosis present

## 2014-03-19 DIAGNOSIS — I63441 Cerebral infarction due to embolism of right cerebellar artery: Secondary | ICD-10-CM | POA: Diagnosis present

## 2014-03-19 DIAGNOSIS — J690 Pneumonitis due to inhalation of food and vomit: Secondary | ICD-10-CM

## 2014-03-19 DIAGNOSIS — I428 Other cardiomyopathies: Secondary | ICD-10-CM | POA: Diagnosis present

## 2014-03-19 DIAGNOSIS — Z9981 Dependence on supplemental oxygen: Secondary | ICD-10-CM | POA: Diagnosis not present

## 2014-03-19 DIAGNOSIS — I959 Hypotension, unspecified: Secondary | ICD-10-CM | POA: Diagnosis present

## 2014-03-19 DIAGNOSIS — J9600 Acute respiratory failure, unspecified whether with hypoxia or hypercapnia: Secondary | ICD-10-CM | POA: Diagnosis not present

## 2014-03-19 DIAGNOSIS — R55 Syncope and collapse: Secondary | ICD-10-CM | POA: Diagnosis present

## 2014-03-19 DIAGNOSIS — Z7982 Long term (current) use of aspirin: Secondary | ICD-10-CM | POA: Diagnosis not present

## 2014-03-19 DIAGNOSIS — N390 Urinary tract infection, site not specified: Secondary | ICD-10-CM | POA: Diagnosis not present

## 2014-03-19 DIAGNOSIS — J189 Pneumonia, unspecified organism: Secondary | ICD-10-CM

## 2014-03-19 DIAGNOSIS — I63449 Cerebral infarction due to embolism of unspecified cerebellar artery: Secondary | ICD-10-CM

## 2014-03-19 DIAGNOSIS — Z66 Do not resuscitate: Secondary | ICD-10-CM

## 2014-03-19 HISTORY — DX: Shortness of breath: R06.02

## 2014-03-19 HISTORY — DX: Cerebral infarction, unspecified: I63.9

## 2014-03-19 HISTORY — DX: Chronic obstructive pulmonary disease, unspecified: J44.9

## 2014-03-19 HISTORY — DX: Malignant melanoma of unspecified part of face: C43.30

## 2014-03-19 HISTORY — DX: Essential (primary) hypertension: I10

## 2014-03-19 HISTORY — DX: Heart failure, unspecified: I50.9

## 2014-03-19 HISTORY — DX: Unspecified atrial fibrillation: I48.91

## 2014-03-19 LAB — I-STAT TROPONIN, ED: TROPONIN I, POC: 0.01 ng/mL (ref 0.00–0.08)

## 2014-03-19 LAB — COMPREHENSIVE METABOLIC PANEL
ALT: 24 U/L (ref 0–35)
AST: 40 U/L — ABNORMAL HIGH (ref 0–37)
Albumin: 3.2 g/dL — ABNORMAL LOW (ref 3.5–5.2)
Alkaline Phosphatase: 80 U/L (ref 39–117)
BUN: 25 mg/dL — AB (ref 6–23)
CALCIUM: 8.9 mg/dL (ref 8.4–10.5)
CO2: 22 mEq/L (ref 19–32)
Chloride: 102 mEq/L (ref 96–112)
Creatinine, Ser: 1.16 mg/dL — ABNORMAL HIGH (ref 0.50–1.10)
GFR calc Af Amer: 50 mL/min — ABNORMAL LOW (ref >90)
GFR calc non Af Amer: 43 mL/min — ABNORMAL LOW (ref >90)
Glucose, Bld: 116 mg/dL — ABNORMAL HIGH (ref 70–99)
Potassium: 4.3 mEq/L (ref 3.7–5.3)
SODIUM: 140 meq/L (ref 137–147)
TOTAL PROTEIN: 6.9 g/dL (ref 6.0–8.3)
Total Bilirubin: 0.5 mg/dL (ref 0.3–1.2)

## 2014-03-19 LAB — CBC
HCT: 37.3 % (ref 36.0–46.0)
Hemoglobin: 11.3 g/dL — ABNORMAL LOW (ref 12.0–15.0)
MCH: 22.2 pg — AB (ref 26.0–34.0)
MCHC: 30.3 g/dL (ref 30.0–36.0)
MCV: 73.1 fL — AB (ref 78.0–100.0)
Platelets: 159 10*3/uL (ref 150–400)
RBC: 5.1 MIL/uL (ref 3.87–5.11)
RDW: 21.4 % — AB (ref 11.5–15.5)
WBC: 14.4 10*3/uL — AB (ref 4.0–10.5)

## 2014-03-19 LAB — PROTIME-INR
INR: 1.36 (ref 0.00–1.49)
Prothrombin Time: 16.4 seconds — ABNORMAL HIGH (ref 11.6–15.2)

## 2014-03-19 LAB — PRO B NATRIURETIC PEPTIDE: PRO B NATRI PEPTIDE: 789.5 pg/mL — AB (ref 0–450)

## 2014-03-19 LAB — TROPONIN I

## 2014-03-19 LAB — RETICULOCYTES
RBC.: 4.84 MIL/uL (ref 3.87–5.11)
Retic Count, Absolute: 62.9 10*3/uL (ref 19.0–186.0)
Retic Ct Pct: 1.3 % (ref 0.4–3.1)

## 2014-03-19 LAB — ETHANOL

## 2014-03-19 LAB — GLUCOSE, CAPILLARY: Glucose-Capillary: 163 mg/dL — ABNORMAL HIGH (ref 70–99)

## 2014-03-19 LAB — D-DIMER, QUANTITATIVE: D-Dimer, Quant: 0.7 ug/mL-FEU — ABNORMAL HIGH (ref 0.00–0.48)

## 2014-03-19 MED ORDER — ALBUTEROL SULFATE (2.5 MG/3ML) 0.083% IN NEBU: 2.5000 mg | INHALATION_SOLUTION | Freq: Four times a day (QID) | RESPIRATORY_TRACT | Status: DC | PRN

## 2014-03-19 MED ORDER — ASPIRIN 81 MG PO TABS
81.0000 mg | ORAL_TABLET | Freq: Every day | ORAL | Status: DC
Start: 2014-03-19 — End: 2014-03-19

## 2014-03-19 MED ORDER — ONDANSETRON HCL 4 MG/2ML IJ SOLN
4.0000 mg | Freq: Once | INTRAMUSCULAR | Status: AC
  Administered 2014-03-19: 4 mg via INTRAVENOUS
  Filled 2014-03-19: qty 2

## 2014-03-19 MED ORDER — WARFARIN SODIUM 3 MG PO TABS
3.0000 mg | ORAL_TABLET | Freq: Once | ORAL | Status: DC
  Filled 2014-03-19: qty 1

## 2014-03-19 MED ORDER — LORAZEPAM 2 MG/ML IJ SOLN: 2.0000 mg | Freq: Once | INTRAMUSCULAR | Status: AC | PRN

## 2014-03-19 MED ORDER — SODIUM CHLORIDE 0.9 % IV BOLUS (SEPSIS)
500.0000 mL | Freq: Once | INTRAVENOUS | Status: AC
  Administered 2014-03-19: 500 mL via INTRAVENOUS

## 2014-03-19 MED ORDER — AMIODARONE HCL 200 MG PO TABS
200.0000 mg | ORAL_TABLET | Freq: Every day | ORAL | Status: DC
  Filled 2014-03-19: qty 1

## 2014-03-19 MED ORDER — ATORVASTATIN CALCIUM 10 MG PO TABS
10.0000 mg | ORAL_TABLET | Freq: Every day | ORAL | Status: DC
Start: 2014-03-20 — End: 2014-03-21
  Administered 2014-03-20: 10 mg via ORAL
  Filled 2014-03-19 (×2): qty 1

## 2014-03-19 MED ORDER — TIOTROPIUM BROMIDE MONOHYDRATE 18 MCG IN CAPS
18.0000 ug | ORAL_CAPSULE | Freq: Every day | RESPIRATORY_TRACT | Status: DC
  Administered 2014-03-21 – 2014-03-22 (×2): 18 ug via RESPIRATORY_TRACT
  Filled 2014-03-19: qty 5

## 2014-03-19 MED ORDER — ALBUTEROL SULFATE HFA 108 (90 BASE) MCG/ACT IN AERS: 2.0000 | INHALATION_SPRAY | Freq: Four times a day (QID) | RESPIRATORY_TRACT | Status: DC | PRN

## 2014-03-19 MED ORDER — ASPIRIN 325 MG PO TABS
325.0000 mg | ORAL_TABLET | Freq: Every day | ORAL | Status: DC
  Administered 2014-03-20 – 2014-03-21 (×2): 325 mg via ORAL
  Filled 2014-03-19 (×4): qty 1

## 2014-03-19 MED ORDER — ALPRAZOLAM 0.5 MG PO TABS
0.5000 mg | ORAL_TABLET | Freq: Every day | ORAL | Status: DC
  Administered 2014-03-20 – 2014-03-21 (×2): 0.5 mg via ORAL
  Filled 2014-03-19 (×4): qty 1

## 2014-03-19 MED ORDER — WARFARIN - PHARMACIST DOSING INPATIENT: Freq: Every day | Status: DC

## 2014-03-19 NOTE — H&P (Signed)
[facility] Admission History and Physical Service Pager: [phone]  Patient name: Stacy Pittman Medical record number: [id number] Date of birth: 1932/11/04 Age: 80 y.o. Gender: female  Primary Care Provider: No primary provider on file. Consultants: neuro Code Status: DNR  Chief Complaint: syncope  Assessment and Plan: Stacy Pittman is a 80 y.o. female presenting with loss of consciousness and TIA symptoms . PMH is significant for HTN, COPD, HLD, afib, and CHF.  # Loss of consciousness: patient with LOC today at dinner table with noted jerking movements of her upper arms and left sided weakness on evaluation. Differential includes neurologic cause (likely given left sided weakness, possible facial droop, and potential seizure activity), cardiogenic causes of LOC (arrhythmia vs MI vs valvular issue), PE (less likely given wells score of 1, though patient with O2 requirement at baseline and not able to cooperate well with history), hypovolemia (potential cause, though appeared to have moist mucus membranes and no documented hypotension), vasovagal issue, hypoglycemia (less likely given normal glucose on lab check). At this time I would favor neurologic cause with TIA/stroke vs seizure as cause. -admit to tele, Attending Dr McDiarmid -will obtain echo, MRI/MRA -neuro consulted by EDP, f/u their recs -will cycle troponins and obtain D-dimer -s/p 500 cc NS bolus in ED -risk stratify with A1c, lipid panel -obtain UDS and alcohol level -PT/OT to see her -nursing swallow eval -will check UA for infectious source -ASA 325 mg  # Leukocytosis: WBC to 14.4. No signs of infection on exam. CXR clear without signs of PNA. Abd soft and non-tender. Has been afebrile. If the patient had a seizure this may be a reactive leukocytosis. -will trend cbc -check UA -monitor for fever and signs of infection -given no signs of infection at this time, will hold off on  antibiotics  # Anemia: Hgb 11.3. No prior values noted. This is microcytic. No mention of bleeding on interview. -will trend CBC -check iron studies -monitor for bleeding  # AKI on CKD: Cr 1.16 with GFR 43. No mention of kidney disease on discussion with family. -will trend creatinine -hold nephrotoxic agents until trend established  # COPD: on home O2 2L [place]. At baseline O2 use. -continue home medications and O2 -monitor respiratory status  # Afib: rate controlled in the ED. -will continue home amiodarone  -warfarin per pharmacy, spoke with pharmacy regarding bridging and she stated that they do not bridge patients that have suffered a stroke vs TIA due to risk of hemorrhagic transformation, and states that typically the stroke team just resumes the patients home anticoagulation, so will resume coumadin at this time  FEN/GI: NPO until passes swallow screen Prophylaxis: warfarin  Disposition: admit to tele, discharge pending completion of work-up  History of Present Illness: Stacy Pittman is a 80 y.o. female presenting with syncope. The patient has a history of HTN, COPD, HLD, afib, and CHF.  History obtained from patients son and patient. Patients son was contacted over the phone at [phone]. His name is Stacy Pittman.   The patients son notes the patient was at home this afternoon around 3:30 pm when she got up to walk to the kitchen. She felt light headed at that time. She then sat down while her son was preparing her meal. Next the son's wife noted to the son that the patient was shaking her arms. He attended to her and said he was going to call 911. When he did this she mumbled at him. During this  time the patients arms shook. The son does not feel that the legs were shaking. He also noted that she was unresponsive. When EMS arrived they noted that the patient had a left face droop (son noted this was at the patients baseline) and that the patient could not move her left side. She  was then brought to the ED. The son denies any incontinence during this episode. The patient states she was sitting down to eat when this happened and noted that she passed out. She denies chest pain, dyspnea, and abdominal pain. They deny any history of stroke, MI, or seizures.   In the Ed she was noted to have no strength or sensory deficits, no facial droop appreciated. Per the EDP report the patient reported being fine on wakening and had some dizziness prior to passing out. There was a CT head without acute abnormalities, CXR without acute changes, negative istat troponin, WBC noted to be elevated to 14, INR of 1.36. She was given a 2 L bolus. The EDP spoke with neurology on call who stated that the patient was not a candidate for TPA and to have her admitted for work-up.  Review Of Systems: Per HPI with the following additions: none Otherwise 12 point review of systems was performed and was unremarkable.  Patient Active Problem List   Diagnosis Date Noted  . Syncope 04-16-2014   Past Medical History: Past Medical History  Diagnosis Date  . Atrial fibrillation   . COPD (chronic obstructive pulmonary disease)   . CHF (congestive heart failure)    Past Surgical History: History reviewed. No pertinent past surgical history. Social History: History  Substance Use Topics  . Smoking status: Never Smoker   . Smokeless tobacco: Not on file  . Alcohol Use: No   Additional social history: none  Please also refer to relevant sections of EMR.  Family History: No family history on file. Allergies and Medications: No Known Allergies No current facility-administered medications on file prior to encounter.   No current outpatient prescriptions on file prior to encounter.    Objective: BP 116/56  Pulse 78  Temp(Src) 97.4 F (36.3 C) (Oral)  Resp 25  SpO2 93% Exam: General: NAD, resting comfortably in bed HEENT: right side of face with scar around the nasolabial fold at the level  of the nare, MMM, dentures in place, pupils small but reactive Cardiovascular: rrr, no murmurs appreciated Respiratory: CTAB, no wheezes or crackles Abdomen: s, NT, ND, no guarding or rebound Extremities: WWP, no edema in LE Skin: scattered scars and sun damage noted Neuro: patient with right gaze preference, did not cooperate well with looking to the left, CN otherwise intact, strength 4/5 throughout bilateral biceps, triceps, grip, hip flexors, quads, 5/5 strength in plantar and dorsiflexion, sensation to light touch intact in bilateral upper and lower extremities, patellar and achilles reflexes absent bilaterally, speech is difficult to understand  Labs and Imaging: CBC BMET   Recent Labs Lab 04-16-2014 1651  WBC 14.4*  HGB 11.3*  HCT 37.3  PLT 159    Recent Labs Lab 2014-04-16 1651  NA 140  K 4.3  CL 102  CO2 22  BUN 25*  CREATININE 1.16*  GLUCOSE 116*  CALCIUM 8.9     iStat troponin neg x1 Pro BNP 789.5  INR 1.36  Dg Chest 2 View  04-16-14   CLINICAL DATA:  Syncope.  EXAM: CHEST  2 VIEW  COMPARISON:  None  FINDINGS: Moderate cardiac enlargement. No pleural effusion. Chronic interstitial  coarsening noted bilaterally. Calcified atherosclerotic change involves the thoracic aorta. No airspace consolidation. The bones appear osteopenic.  IMPRESSION: 1. Cardiac enlargement and chronic interstitial coarsening. 2. Atherosclerosis.   Electronically Signed   By: Kerby Moors M.D.   On: 23-Mar-2014 17:28   Ct Head Wo Contrast  03/23/14   CLINICAL DATA:  Mental status changes.  EXAM: CT HEAD WITHOUT CONTRAST  TECHNIQUE: Contiguous axial images were obtained from the base of the skull through the vertex without intravenous contrast.  COMPARISON:  None.  FINDINGS: There is diffuse low attenuation throughout the subcortical and periventricular white matter bilaterally compatible with chronic small vessel ischemic disease. There is prominence of the sulci and ventricles compatible with  brain atrophy. No acute cortical infarct, hemorrhage, or mass lesion ispresent. No significant extra-axial fluid collection is present. There is mild mucosal thickening involving the posterior aspect of the right maxillary sinus. The paranasal sinuses are clear. The mastoid air cells are clear. The skull is intact.  IMPRESSION: 1. Small vessel ischemic disease and brain atrophy. 2. No acute intracranial abnormalities   Electronically Signed   By: Kerby Moors M.D.   On: 03-23-14 17:21   EKG: ectopic atrial tachycardia, no apparent ischemic changes  Leone Haven, MD Mar 23, 2014, 6:20 PM PGY-2, [facility] pager: [phone], text pages welcome
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
## 2014-03-19 NOTE — ED Notes (Signed)
Attempted report 

## 2014-03-19 NOTE — Consult Note (Signed)
Neurology Consultation Reason for Consult: Likely seizure Referring Physician: Evelina Bucy  CC: passing out  History is obtained from:the patient, medical record, granddaughter  HPI: Stacy Pittman is a 80 y.o. female with a history of melanoma on the face in 2003 as well as atrophic fibrillation on Coumadin with a subtherapeutic INR.per the referring physician, the patient was in the kitchen became lightheaded. The patient states that she remembers becoming lightheaded, but then feels that she passed out for a period of time. She does remember EMS arriving at her house, and feels that she was confused. She continues to be very soft spoken and sleepy.  She initially was reported to have left hemiparesis, but this has rapidly improved.She has a mildly disconjugate gaze which the granddaughter reports is new.   LKW: unclear to me exact time of onset as well as his are not here to give exact time,patient arrived shortly before 5 PM. tpa given?: no, seizure onset,stroke was not initially suspected, rapidly improving symptoms    ROS: A 14 point ROS was performed and is negative except as noted in the HPI.  Past Medical History  Diagnosis Date  . Atrial fibrillation   . COPD (chronic obstructive pulmonary disease)   . CHF (congestive heart failure)   . Cancer     skin    Family History: No history of seizures  Social History: Tob: denies  Exam: Current vital signs: BP 113/43  Pulse 74  Temp(Src) 98.1 F (36.7 C) (Oral)  Resp 25  Wt 64.365 kg (141 lb 14.4 oz)  SpO2 92% Vital signs in last 24 hours: Temp:  [97.4 F (36.3 C)-98.1 F (36.7 C)] 98.1 F (36.7 C) (04/07 2007) Pulse Rate:  [67-78] 74 (04/07 2007) Resp:  [12-28] 25 (04/07 1800) BP: (101-121)/(43-70) 113/43 mmHg (04/07 2007) SpO2:  [85 %-98 %] 92 % (04/07 2007) FiO2 (%):  [3 %] 3 % (04/07 2007) Weight:  [64.365 kg (141 lb 14.4 oz)] 64.365 kg (141 lb 14.4 oz) (04/07 2007)  General: in bed, nad CV: Regular  rate and rhythm Mental Status: Patient is awake, alert, oriented to person, place, month, year, and situation. She has a soft voice, and mumbles when speaking. Patient is able to give somewhat of a history. It is difficult to understand her, however. No signs of aphasia or neglect Cranial Nerves: II: Visual Fields are full. Pupils are equal, round, and reactive to light.  Discs are difficult to visualize. III,IV, VI: She has a right hypermetria. She is able to look laterally fully with both eyes V: Facial sensation is symmetric to temperature VII: Facial movement is difficult to discern do to scar on right cheek VIII: hearing is intact to voice X: Uvula elevates symmetrically XI: Shoulder shrug is symmetric. XII: tongue is midline without atrophy or fasciculations.  Motor: Tone is normal. Bulk is normal. 4/5 strength was present in all four extremities, she appears weak, but this is symmetric. Sensory: Sensation is symmetric to light touch and temperature in the arms and legs. Deep Tendon Reflexes: 2+ and symmetric in the biceps and patellae.  Cerebellar: She has difficulty with finger-nose-finger bilaterally. Gait: Not tested due to patient safety concerns    I have reviewed labs in epic and the results pertinent to this consultation are: CMP-mildly elevated creatinine  I have reviewed the images obtained: CT head-no acute findings. There is significant hypodensity in the subcortical white matter.  Impression: 80 year old female with a history of melanoma who presents with new onset seizure.  She also has a history of atrial fibrillation with subtherapeutic INR. Possibilities include seizure, stroke with subsequent seizure. She will need an MRI to rule out mass lesion or stroke.  Recommendations: 1) MRI brain 2) EEG 3) if stroke is apparent on the MRI, then we'll pursue stroke workup at that time. 4) I would favor aspirin therapy rather than anticoagulation pending her MRI to  rule out ischemia.   Roland Rack, MD [facility] [phone]  If 7pm- 7am, please page neurology on call as listed in [place].

## 2014-03-19 NOTE — ED Notes (Signed)
When in to take pt up to room DR. At bedside will take her up as soon as he is done.

## 2014-03-19 NOTE — ED Notes (Signed)
Per Pt son- sts patient was in the kitchen near the stove and stated to son that patient felt lightheaded. Son placed Patient on chair and wife screamed out, son went to patient sts "patients arms were flailing out every which way". sts seizure-like activity lasted approximately 3-4 min then patient was unresponsive on chair.   Family sts patient was in [facility] for CHF exacerbation. Family sts left side droops at baseline.

## 2014-03-19 NOTE — ED Notes (Signed)
Went to check on pt.Stacy Pittman she wasn't ready to get off bedpan yet.

## 2014-03-19 NOTE — Progress Notes (Addendum)
ANTICOAGULATION CONSULT NOTE - Initial Consult  Pharmacy Consult for Coumadin Indication: atrial fibrillation  No Known Allergies  Patient Measurements: Weight: 141 lb 14.4 oz (64.365 kg)  Vital Signs: Temp: 98.1 F (36.7 C) (04/07 2007) Temp src: Oral (04/07 2007) BP: 113/43 mmHg (04/07 2007) Pulse Rate: 74 (04/07 2007)  Labs:  Recent Labs  03/24/14 1651  HGB 11.3*  HCT 37.3  PLT 159  LABPROT 16.4*  INR 1.36  CREATININE 1.16*    CrCl is unknown because there is no height on file for the current visit.   Medical History: Past Medical History  Diagnosis Date  . Atrial fibrillation   . COPD (chronic obstructive pulmonary disease)   . CHF (congestive heart failure)   . Cancer     skin    Medications:  Prescriptions prior to admission  Medication Sig Dispense Refill  . albuterol (PROVENTIL HFA;VENTOLIN HFA) 108 (90 BASE) MCG/ACT inhaler Inhale 2 puffs into the lungs every 6 (six) hours as needed for wheezing or shortness of breath.      . ALPRAZolam (XANAX) 0.5 MG tablet Take 0.5 mg by mouth 3 (three) times daily.      Marland Kitchen amiodarone (PACERONE) 200 MG tablet Take 200 mg by mouth daily.      Marland Kitchen aspirin 81 MG tablet Take 81 mg by mouth daily.      Marland Kitchen atorvastatin (LIPITOR) 10 MG tablet Take 10 mg by mouth daily.      . furosemide (LASIX) 20 MG tablet Take 10 mg by mouth.      . gabapentin (NEURONTIN) 300 MG capsule Take 300 mg by mouth 3 (three) times daily.      Marland Kitchen lisinopril (PRINIVIL,ZESTRIL) 10 MG tablet Take 10 mg by mouth daily.      . mirtazapine (REMERON) 15 MG tablet Take 15 mg by mouth at bedtime.      . OXYGEN-HELIUM IN Inhale 2 L into the lungs at bedtime.      . potassium chloride (K-DUR) 10 MEQ tablet Take 20 mEq by mouth daily.      Marland Kitchen tiotropium (SPIRIVA) 18 MCG inhalation capsule Place into inhaler and inhale daily.      Marland Kitchen tiotropium (SPIRIVA) 18 MCG inhalation capsule Place 18 mcg into inhaler and inhale daily.      Marland Kitchen warfarin (COUMADIN) 1 MG tablet  Take 1 mg by mouth daily. 1mg  daily, except on Mon/Thus 1.5mg         Assessment: 80 y/o female who presents to the ED with syncope and is being worked up for stroke vs seizure. Patient takes chronic Coumadin for Afib. Pharmacy consulted to manage while inpatient. INR is subtherapeutic at 1.36. No bleeding noted, CBC is normal. CT head with no acute abnormalities.   PTA Coumadin dosing is 1 mg daily except 1.5 mg on Mon and Thurs (confirmed with patient). She has not had her dose today.  Goal of Therapy:  INR 2-3 Monitor platelets by anticoagulation protocol: Yes   Plan:  - Coumadin 3 mg PO tonight - INR daily - Monitor for s/sx of bleeding  [facility], Pharm.D., BCPS Clinical Pharmacist Pager: [phone] March 24, 2014 8:30 PM   Addendum: Pharmacy consulted to dose heparin bridge. IV heparin is not usually recommended in patients with acute stroke due to risk for hemorrhagic transformation and no difference in recurrent stroke. Discussed this with Dr. Caryl Bis and he was ok with Coumadin alone.  Stroke. [id number].  [place], Pharm.D., BCPS Clinical Pharmacist Pager: [phone] 03/24/14 9:09 PM

## 2014-03-19 NOTE — ED Notes (Signed)
RN transported patient to CT and Xray- pt still c/o nausea and spitting up. Dr.Walden updated on pt status and at bedside.

## 2014-03-19 NOTE — ED Provider Notes (Signed)
CSN: [id number]     Arrival date & time 2014/03/28  1636 History   First MD Initiated Contact with Patient 2014/03/28 1642     Chief Complaint  Patient presents with  . Loss of Consciousness     (Consider location/radiation/quality/duration/timing/severity/associated sxs/prior Treatment) Patient is a 80 y.o. female presenting with syncope. The history is provided by the patient and the EMS personnel.  Loss of Consciousness Episode history:  Single Most recent episode:  Today Timing:  Constant Progression:  Resolved Chronicity:  Recurrent Context comment:  Felt dizzy - other circumstances unknown Witnessed: no   Relieved by:  Nothing Worsened by:  Nothing tried Associated symptoms: dizziness, nausea and vomiting   Associated symptoms: no chest pain, no fever and no shortness of breath     No past medical history on file. No past surgical history on file. No family history on file. History  Substance Use Topics  . Smoking status: Not on file  . Smokeless tobacco: Not on file  . Alcohol Use: Not on file   OB History   No data available     Review of Systems  Constitutional: Negative for fever and chills.  Respiratory: Negative for cough and shortness of breath.   Cardiovascular: Positive for syncope. Negative for chest pain.  Gastrointestinal: Positive for nausea and vomiting. Negative for abdominal pain.  Neurological: Positive for dizziness.  All other systems reviewed and are negative.      Allergies  Review of patient's allergies indicates not on file.  Home Medications  No current outpatient prescriptions on file. SpO2 98% Physical Exam  Nursing note and vitals reviewed. Constitutional: She is oriented to person, place, and time. She appears well-developed and well-nourished. No distress.  HENT:  Head: Normocephalic and atraumatic.  Mouth/Throat: Oropharynx is clear and moist.  Eyes: EOM are normal. Pupils are equal, round, and reactive to light.  Neck:  Normal range of motion. Neck supple.  Cardiovascular: Normal rate and regular rhythm.  Exam reveals no friction rub.   No murmur heard. Pulmonary/Chest: Effort normal and breath sounds normal. No respiratory distress. She has no wheezes. She has no rales.  Abdominal: Soft. She exhibits no distension. There is no tenderness. There is no rebound.  Musculoskeletal: Normal range of motion. She exhibits no edema.  Neurological: She is alert and oriented to person, place, and time. No cranial nerve deficit. She exhibits normal muscle tone.  Skin: No rash noted. She is not diaphoretic.    ED Course  Procedures (including critical care time) Labs Review Labs Reviewed  CBC  COMPREHENSIVE METABOLIC PANEL  URINALYSIS, ROUTINE W REFLEX MICROSCOPIC  PRO B NATRIURETIC PEPTIDE  PROTIME-INR  I-STAT TROPOININ, ED   Imaging Review Dg Chest 2 View  03-28-14   CLINICAL DATA:  Syncope.  EXAM: CHEST  2 VIEW  COMPARISON:  None  FINDINGS: Moderate cardiac enlargement. No pleural effusion. Chronic interstitial coarsening noted bilaterally. Calcified atherosclerotic change involves the thoracic aorta. No airspace consolidation. The bones appear osteopenic.  IMPRESSION: 1. Cardiac enlargement and chronic interstitial coarsening. 2. Atherosclerosis.   Electronically Signed   By: Kerby Moors M.D.   On: 2014/03/28 17:28   Ct Head Wo Contrast  03-28-2014   CLINICAL DATA:  Mental status changes.  EXAM: CT HEAD WITHOUT CONTRAST  TECHNIQUE: Contiguous axial images were obtained from the base of the skull through the vertex without intravenous contrast.  COMPARISON:  None.  FINDINGS: There is diffuse low attenuation throughout the subcortical and periventricular white matter bilaterally  compatible with chronic small vessel ischemic disease. There is prominence of the sulci and ventricles compatible with brain atrophy. No acute cortical infarct, hemorrhage, or mass lesion ispresent. No significant extra-axial fluid  collection is present. There is mild mucosal thickening involving the posterior aspect of the right maxillary sinus. The paranasal sinuses are clear. The mastoid air cells are clear. The skull is intact.  IMPRESSION: 1. Small vessel ischemic disease and brain atrophy. 2. No acute intracranial abnormalities   Electronically Signed   By: Kerby Moors M.D.   On: 04/04/2014 17:21   Ct Angio Chest Pe W/cm &/or Wo Cm  03/20/2014   CLINICAL DATA:  Weakness and elevated D-dimer.  EXAM: CT ANGIOGRAPHY CHEST WITH CONTRAST  TECHNIQUE: Multidetector CT imaging of the chest was performed using the standard protocol during bolus administration of intravenous contrast. Multiplanar CT image reconstructions and MIPs were obtained to evaluate the vascular anatomy.  CONTRAST:  56mL OMNIPAQUE IOHEXOL 350 MG/ML SOLN  COMPARISON:  Chest radiograph performed 03/20/2014, and CTA of the chest performed 12/10/2013  FINDINGS: There is no evidence of pulmonary embolus.  Trace bilateral pleural effusions are noted. Bibasilar airspace opacification may reflect atelectasis or pneumonia. Underlying emphysema is noted bilaterally. Interstitial prominence is also noted; this could reflect minimal interstitial edema. There is no evidence of pneumothorax. No masses are identified; no abnormal focal contrast enhancement is seen.  Mild biatrial enlargement is noted. Diffuse coronary artery calcifications are seen. Calcification is seen along the thoracic aorta and proximal great vessels. No pericardial effusion is identified. No mediastinal lymphadenopathy is seen. No axillary lymphadenopathy is seen. Tiny nonspecific hypodensities are seen within the thyroid gland.  The visualized portions of the liver and spleen are unremarkable.  No acute osseous abnormalities are seen.  Review of the MIP images confirms the above findings.  IMPRESSION: 1. No evidence of pulmonary embolus. 2. Trace bilateral pleural effusions noted. Bibasilar airspace  opacification may reflect atelectasis or pneumonia. Interstitial prominence also seen; this could reflect minimal interstitial edema. 3. Underlying emphysema noted bilaterally. 4. Mild biatrial enlargement seen. 5. Diffuse coronary artery calcifications seen.   Electronically Signed   By: Garald Balding M.D.   On: 03/20/2014 03:37     EKG Interpretation   Date/Time:  Tuesday March 19 2014 16:42:13 EDT Ventricular Rate:  103 PR Interval:  150 QRS Duration: 118 QT Interval:  408 QTC Calculation: 534 R Axis:   -81 Text Interpretation:  Ectopic atrial tachycardia, unifocal Incomplete RBBB  and LAFB Anteroseptal infarct, age indeterminate Artifact in lead(s) I III  aVL No prior EKG to compare to Confirmed by Mingo Amber  MD, Emery ([id number]) on  03/31/2014 4:50:14 PM      MDM   Final diagnoses:  Syncope    89F presents after a syncopal episode. She was outside in family noticed she had left, they found her in the car. Upon firefighters arriving, she was breathing slowly with respirations less than 10 per minute. She was unresponsive. She was treated with airway opening maneuvers and began to increase her breathing. With EMS she had initially some left facial droop and left arm weakness, described as total flaccid paralysis. Stable vitals with EMS. Upon arrival here, patient is breathing on her own, no strength or sensory deficits noted. No facial droop appreciated. No need for Code Stroke call at this time. She answer questions, but is nauseated and is retching in the room. Zofran given. She remembers feeling fine upon wakening and having some dizziness prior to passing  out. Lungs are clear, belly is benign. She does state she is on blood thinners for A. fib and has some scars on her chest, but cannot provide more history than that. We'll start with basic labs, CT head, chest x-ray, EKG. Nurse noted she was hypoxic on room air in the mid 80s, placed on oxygen. No hx of O2 requirement. Nurse also noted  some dysarthria and facial droop on a complete NIH scale. I spoke with Neurology about these findings, state no need for Code Stroke as not a tPA candidate at this time. Neuro will see patient. With hypoxia, concern for PE, CT angio chest ordered. Family provided different story per EMS - allegedly had seizure like activity in the kitchen, no falls, no syncope noted.  Family Medicine admitting.   Osvaldo Shipper, MD 03/20/14 1004

## 2014-03-20 ENCOUNTER — Inpatient Hospital Stay (HOSPITAL_COMMUNITY): Payer: Medicare HMO

## 2014-03-20 ENCOUNTER — Encounter (HOSPITAL_COMMUNITY): Payer: Self-pay | Admitting: Radiology

## 2014-03-20 DIAGNOSIS — G459 Transient cerebral ischemic attack, unspecified: Secondary | ICD-10-CM

## 2014-03-20 LAB — CBC
HCT: 34.4 % — ABNORMAL LOW (ref 36.0–46.0)
HEMOGLOBIN: 10.5 g/dL — AB (ref 12.0–15.0)
MCH: 22.2 pg — ABNORMAL LOW (ref 26.0–34.0)
MCHC: 30.5 g/dL (ref 30.0–36.0)
MCV: 72.6 fL — AB (ref 78.0–100.0)
Platelets: 127 10*3/uL — ABNORMAL LOW (ref 150–400)
RBC: 4.74 MIL/uL (ref 3.87–5.11)
RDW: 21.3 % — ABNORMAL HIGH (ref 11.5–15.5)
WBC: 12.9 10*3/uL — ABNORMAL HIGH (ref 4.0–10.5)

## 2014-03-20 LAB — IRON AND TIBC
IRON: 21 ug/dL — AB (ref 42–135)
Saturation Ratios: 5 % — ABNORMAL LOW (ref 20–55)
TIBC: 392 ug/dL (ref 250–470)
UIBC: 371 ug/dL (ref 125–400)

## 2014-03-20 LAB — TROPONIN I
Troponin I: <0.3 ng/mL (ref <0.30)
Troponin I: <0.3 ng/mL (ref <0.30)

## 2014-03-20 LAB — GLUCOSE, CAPILLARY
Glucose-Capillary: 125 mg/dL — ABNORMAL HIGH (ref 70–99)
Glucose-Capillary: 112 mg/dL — ABNORMAL HIGH (ref 70–99)
Glucose-Capillary: 120 mg/dL — ABNORMAL HIGH (ref 70–99)
Glucose-Capillary: 230 mg/dL — ABNORMAL HIGH (ref 70–99)

## 2014-03-20 LAB — BASIC METABOLIC PANEL
BUN: 26 mg/dL — ABNORMAL HIGH (ref 6–23)
CO2: 19 meq/L (ref 19–32)
Calcium: 8.2 mg/dL — ABNORMAL LOW (ref 8.4–10.5)
Chloride: 104 mEq/L (ref 96–112)
Creatinine, Ser: 1.09 mg/dL (ref 0.50–1.10)
GFR calc Af Amer: 54 mL/min — ABNORMAL LOW (ref >90)
GFR calc non Af Amer: 47 mL/min — ABNORMAL LOW (ref >90)
GLUCOSE: 126 mg/dL — AB (ref 70–99)
POTASSIUM: 5.3 meq/L (ref 3.7–5.3)
SODIUM: 139 meq/L (ref 137–147)

## 2014-03-20 LAB — FERRITIN: Ferritin: 28 ng/mL (ref 10–291)

## 2014-03-20 LAB — LIPID PANEL
Cholesterol: 94 mg/dL (ref 0–200)
HDL: 34 mg/dL — ABNORMAL LOW (ref >39)
LDL CALC: 47 mg/dL (ref 0–99)
Total CHOL/HDL Ratio: 2.8 RATIO
Triglycerides: 66 mg/dL (ref <150)
VLDL: 13 mg/dL (ref 0–40)

## 2014-03-20 LAB — PROTIME-INR
INR: 1.44 (ref 0.00–1.49)
Prothrombin Time: 17.2 seconds — ABNORMAL HIGH (ref 11.6–15.2)

## 2014-03-20 LAB — HEMOGLOBIN A1C
HEMOGLOBIN A1C: 6.2 % — AB (ref <5.7)
Mean Plasma Glucose: 131 mg/dL — ABNORMAL HIGH (ref <117)

## 2014-03-20 MED ORDER — SODIUM CHLORIDE 0.9 % IV BOLUS (SEPSIS): 500.0000 mL | Freq: Once | INTRAVENOUS | Status: DC

## 2014-03-20 MED ORDER — LEVOFLOXACIN IN D5W 750 MG/150ML IV SOLN: 750.0000 mg | INTRAVENOUS | Status: DC

## 2014-03-20 MED ORDER — WARFARIN SODIUM 3 MG PO TABS
3.0000 mg | ORAL_TABLET | Freq: Once | ORAL | Status: AC
  Administered 2014-03-20: 3 mg via ORAL
  Filled 2014-03-20: qty 1

## 2014-03-20 MED ORDER — SODIUM CHLORIDE 0.9 % IV SOLN
INTRAVENOUS | Status: DC
  Administered 2014-03-20 – 2014-03-23 (×3): via INTRAVENOUS

## 2014-03-20 MED ORDER — IOHEXOL 350 MG/ML SOLN
80.0000 mL | Freq: Once | INTRAVENOUS | Status: AC | PRN
  Administered 2014-03-20: 80 mL via INTRAVENOUS

## 2014-03-20 MED ORDER — LEVOFLOXACIN IN D5W 750 MG/150ML IV SOLN
750.0000 mg | Freq: Once | INTRAVENOUS | Status: DC
  Filled 2014-03-20: qty 150

## 2014-03-20 MED ORDER — MOMETASONE FURO-FORMOTEROL FUM 100-5 MCG/ACT IN AERO
2.0000 | INHALATION_SPRAY | Freq: Two times a day (BID) | RESPIRATORY_TRACT | Status: DC
  Administered 2014-03-20 – 2014-03-24 (×6): 2 via RESPIRATORY_TRACT
  Filled 2014-03-20 (×3): qty 8.8

## 2014-03-20 NOTE — Progress Notes (Signed)
OT Cancellation Note  Patient Details Name: Stacy Pittman MRN: [id number] DOB: September 02, 1932   Cancelled Treatment:    Reason Eval/Treat Not Completed: Other (comment). Per nursing, pt is supposed to be going to Echo shortly. Will check back later as schedule permits.  Alycia Patten Raysa Bosak [phone] 03/20/2014, 9:53 AM

## 2014-03-20 NOTE — Progress Notes (Signed)
Family Practice Teaching Service Interval Progress Note  Entered in delay related to EPIC downtime. Patient seen at 2 am.  Patients D-dimer returned elevated to 0.70. Initially ordered VQ scan to evaluate for PE, though this would not likely be done until the morning. I then reviewed the patients vitals and she had an increased O2 requirement to 3 L  with saturations in the low 90's. I went to evaluate her and found her blood pressure to be 86/42 and O2 sat on 3 L to be 92%.  I discussed the patients GFR and the elevated d-dimer and my concern for PE with the radiologist in the reading room. They stated that typically they do not attempt CTAs with a GFR <30 and take measures to be cautious in those with GFR 30-45. With the patients GFR of 43 and the ability of Radiology to use a lower contrast dye load I felt the benefits of obtaining this study out weigh the risks and decided to proceed with the CTA to rule out PE in the setting of increasing O2 requirement and worsening blood pressure. She will be given a 500 L bolus of NS. She will have a CTA chest as well.   Tommi Rumps, MD Family Medicine PGY-2 Service Pager [phone]

## 2014-03-20 NOTE — Progress Notes (Addendum)
ANTICOAGULATION CONSULT NOTE - Follow Up Consult  Pharmacy Consult for Coumadin Indication: atrial fibrillation  No Known Allergies  Patient Measurements: Weight: 141 lb 14.4 oz (64.365 kg) Heparin Dosing Weight:   Vital Signs: Temp: 99.7 F (37.6 C) (04/08 0809) Temp src: Oral (04/08 0809) BP: 104/36 mmHg (04/08 0809) Pulse Rate: 70 (04/08 0809)  Labs:  Recent Labs  04/07/2014 1651 03/25/2014 2048 03/20/14 0422 03/20/14 0800  HGB 11.3*  --  10.5*  --   HCT 37.3  --  34.4*  --   PLT 159  --  127*  --   LABPROT 16.4*  --  17.2*  --   INR 1.36  --  1.44  --   CREATININE 1.16*  --  1.09  --   TROPONINI  --  <0.30 <0.30 <0.30    CrCl is unknown because there is no height on file for the current visit.  Assessment: 82yof admitted for possible seizure vs CVA continuing on chronic Coumadin for hx Afib. INR (1.44) is subtherapeutic. Please note, patient did not receive ordered Coumadin dose on 4/7 PM due to failed swallow screen - screening is being repeated today.  - H/H and Plts trending down - No significant bleeding reported - CT head - no acute abnormalities - PTA regimen: 1 mg daily except 1.5 mg on Mon and Thurs   Goal of Therapy:  INR 2-3   Plan:  1. Coumadin 3mg  po x 1 today 2. Follow-up repeat swallow screen, daily INR, Neuro recommendations  Patsey Berthold [facility] [phone] 03/20/2014,11:23 AM    Addendum: Per SLP note, give whole meds with liquid or in puree  --> continue with ordered Coumadin.  Janina Mayo, PharmD Clinical Pharmacist [phone] 03/20/2014, 3:25 PM

## 2014-03-20 NOTE — Evaluation (Signed)
Clinical/Bedside Swallow Evaluation Patient Details  Name: Stacy Pittman MRN: [id number] Date of Birth: 1932/08/16  Today's Date: 03/20/2014 Time: 8756-4332 SLP Time Calculation (min): 22 min  Past Medical History:  Past Medical History  Diagnosis Date  . Atrial fibrillation   . COPD (chronic obstructive pulmonary disease)   . CHF (congestive heart failure)   . Cancer     skin  . Hypertension   . Shortness of breath    Past Surgical History:  Past Surgical History  Procedure Laterality Date  . Skin cancer excision    . Tubal ligation    . Cataract extraction     HPI:  Stacy Pittman is a 80 y.o. female presenting with loss of consciousness and TIA symptoms . PMH is significant for HTN, COPD, HLD, afib, and CHF. RN reported pt getting choked on liquids this morning.   Assessment / Plan / Recommendation Clinical Impression  Pt demonstrated overt s/s of aspiration (strong cough) on initial cup sip of thin liquid- may have been d/t breath/swallow incoordination. After cued to take small sips at a time, no signs were noted. Prolonged mastication with cracker- no bottom teeth- pt said she prefers soft, moist foods. Pt at risk of aspiration when taking large sips of thin liquid, or with some regular solids d/t COPD. Recommend dysphagia 3 diet, thin liquids, no straws, meds whole with liquid and if difficulties arise with this, change to whole in puree. Educated pt on diet recommendations and strategy of taking small bites and sips at a time. Speech will continue to follow to ensure diet tolerance and use of recommended strategies.    Aspiration Risk  Mild    Diet Recommendation Dysphagia 3 (Mechanical Soft);Thin liquid   Liquid Administration via: Cup;No straw Medication Administration: Whole meds with liquid Supervision: Patient able to self feed;Intermittent supervision to cue for compensatory strategies Compensations: Slow rate;Small sips/bites Postural Changes and/or Swallow  Maneuvers: Seated upright 90 degrees;Upright 30-60 min after meal    Other  Recommendations Oral Care Recommendations: Oral care BID   Follow Up Recommendations  None    Frequency and Duration min 1 x/week  1 week   Pertinent Vitals/Pain n/a    SLP Swallow Goals     Swallow Study Prior Functional Status  Available Help at Discharge: Family    General HPI: Stacy Pittman is a 80 y.o. female presenting with loss of consciousness and TIA symptoms . PMH is significant for HTN, COPD, HLD, afib, and CHF. RN reported pt getting choked on liquids this morning. Type of Study: Bedside swallow evaluation Diet Prior to this Study: NPO Temperature Spikes Noted: Yes Respiratory Status: Nasal cannula History of Recent Intubation: No Behavior/Cognition: Alert;Cooperative Oral Cavity - Dentition: Dentures, top Self-Feeding Abilities: Able to feed self Patient Positioning: Upright in bed Baseline Vocal Quality: Clear Volitional Cough: Strong Volitional Swallow: Able to elicit    Oral/Motor/Sensory Function Overall Oral Motor/Sensory Function: Impaired Labial ROM: Reduced left Labial Symmetry: Abnormal symmetry left Labial Strength: Within Functional Limits Lingual ROM: Within Functional Limits Lingual Symmetry: Within Functional Limits Lingual Strength: Within Functional Limits Mandible: Within Functional Limits   Ice Chips Ice chips: Not tested   Thin Liquid Thin Liquid: Impaired Presentation: Cup Pharyngeal  Phase Impairments: Cough - Immediate (on initial sip of thin (large))    Nectar Thick Nectar Thick Liquid: Not tested   Honey Thick Honey Thick Liquid: Not tested   Puree Puree: Within functional limits   Solid   GO    Solid: Impaired  Presentation: Self Fed Oral Phase Impairments: Impaired mastication       Stacy Pittman Stacy Abler, MA, CCC-SLP 03/20/2014,2:32 PM

## 2014-03-20 NOTE — Progress Notes (Signed)
Family Medicine Teaching Service Daily Progress Note Intern Pager: [phone]  Patient name: Stacy Pittman Medical record number: [id number] Date of birth: 1932/06/11 Age: 80 y.o. Gender: female  Primary Care Provider: No primary provider on file. Consultants: None Code Status: DNR  Pt Overview and Major Events to Date:  4/7: admitted  Assessment and Plan: Stacy Pittman is a 80 y.o. female presenting with loss of consciousness and TIA symptoms . PMH is significant for HTN, COPD, HLD, afib, and CHF.   # Loss of consciousness: possible seizure vs CVA. -follow up echo, MRI/MRA  -neuro consulted by EDP, f/u their recs  -PT/OT to see her  -nursing swallow eval  -ASA 325 mg   # Leukocytosis: WBC to 14.4. No signs of infection on exam. CXR clear without signs of PNA. Abd soft and non-tender. Has been afebrile. If the patient had a seizure this may be a reactive leukocytosis.  -will trend cbc  -check UA  -monitor for fever and signs of infection  -given no signs of infection at this time, will hold off on antibiotics   # Anemia: Hgb 11.3. No prior values noted. This is microcytic. No mention of bleeding on interview.  -will trend CBC  -monitor for bleeding   # AKI on CKD: Cr 1.16 with GFR 43. No mention of kidney disease on discussion with family.  -will trend creatinine  -hold nephrotoxic agents until trend established   # COPD: on home O2 2L [place]. At baseline O2 use. Patient uses Advair and albuterol at home - Dulera BID - Home Spiriva daily - albuterol q6hrs PRN for wheezing and shortness of breath -monitor respiratory status   # Afib: rate controlled in the ED.  -will continue home amiodarone  -warfarin per pharmacy, spoke with pharmacy regarding bridging and she stated that they do not bridge patients that have suffered a stroke vs TIA due to risk of hemorrhagic transformation, and states that typically the stroke team just resumes the patients home anticoagulation, so will  resume coumadin at this time   FEN/GI: NPO until passes swallow screen  Prophylaxis: warfarin  Disposition: pending neuro recommendations  Subjective:  Complaints of some lightheadedness. Shortness of breath is baseline. otherwise has been doing well overnight.  Objective: Temp:  [97.4 F (36.3 C)-98.8 F (37.1 C)] 98.8 F (37.1 C) (04/08 0400) Pulse Rate:  [64-78] 75 (04/08 0400) Resp:  [12-28] 25 (04/07 1800) BP: (97-121)/(35-70) 97/35 mmHg (04/08 0400) SpO2:  [85 %-98 %] 97 % (04/08 0400) FiO2 (%):  [3 %] 3 % (04/08 0004) Weight:  [64.365 kg (141 lb 14.4 oz)] 64.365 kg (141 lb 14.4 oz) (04/07 2007)  Physical Exam: General: Laying in bed, in no acute distress, looks somewhat ill Cardiovascular: Regular rate/rhythm, no murmurs, distal pulses intact Respiratory: Wheezing bilaterally, no increased work of breathing Abdomen: soft, non-tender, non-distended Extremities: warm, no calf tenderness  Laboratory:  Recent Labs Lab 04-06-2014 1651 03/20/14 0422  WBC 14.4* 12.9*  HGB 11.3* 10.5*  HCT 37.3 34.4*  PLT 159 127*    Recent Labs Lab 04-06-14 1651 03/20/14 0422  NA 140 139  K 4.3 5.3  CL 102 104  CO2 22 19  BUN 25* 26*  CREATININE 1.16* 1.09  CALCIUM 8.9 8.2*  PROT 6.9  --   BILITOT 0.5  --   ALKPHOS 80  --   ALT 24  --   AST 40*  --   GLUCOSE 116* 126*   Cardiac Panel (last 3 results)  Recent Labs  03/29/2014 2048 03/20/14 0422  TROPONINI <0.30 <0.30   D-Dimer: 0.70  Imaging/Diagnostic Tests:  Dg Chest 2 View  03/18/2014 IMPRESSION: 1. Cardiac enlargement and chronic interstitial coarsening. 2. Atherosclerosis.   Electronically Signed   By: Kerby Moors M.D.   On: 03/30/2014 17:28   Ct Head Wo Contrast  04/03/2014 IMPRESSION: 1. Small vessel ischemic disease and brain atrophy. 2. No acute intracranial abnormalities   Electronically Signed   By: Kerby Moors M.D.   On: 04/10/2014 17:21   Ct Angio Chest Pe W/cm &/or Wo  Cm  03/20/2014 IMPRESSION: 1. No evidence of pulmonary embolus. 2. Trace bilateral pleural effusions noted. Bibasilar airspace opacification may reflect atelectasis or pneumonia. Interstitial prominence also seen; this could reflect minimal interstitial edema. 3. Underlying emphysema noted bilaterally. 4. Mild biatrial enlargement seen. 5. Diffuse coronary artery calcifications seen.   Electronically Signed   By: Garald Balding M.D.   On: 03/20/2014 03:37    Cordelia Poche, MD 03/20/2014, 7:24 AM PGY-1, [place] Intern pager: [phone], text pages welcome

## 2014-03-20 NOTE — Progress Notes (Signed)
EEG Completed; Results Pending  

## 2014-03-20 NOTE — Care Management Note (Addendum)
  Page 1 of 1   03/20/2014     4:59:59 PM   CARE MANAGEMENT NOTE 03/20/2014  Patient:  Stacy Pittman, Stacy Pittman   Account Number:  [id number]  Date Initiated:  03/20/2014  Documentation initiated by:  Mariann Laster  Subjective/Objective Assessment:   loss of consciousness and TIA symptoms . PMH is significant for HTN, COPD, HLD, afib, and CHF.     Action/Plan:   CM to follow for dispositon needs   Anticipated DC Date:  03/23/2014   Anticipated DC Plan:  HOME/SELF CARE         Choice offered to / List presented to:             Status of service:  In process, will continue to follow Medicare Important Message given?   (If response is "NO", the following Medicare IM given date fields will be blank) Date Medicare IM given:   Date Additional Medicare IM given:    Discharge Disposition:    Per UR Regulation:  Reviewed for med. necessity/level of care/duration of stay  If discussed at [place] of Stay Meetings, dates discussed:    Comments:

## 2014-03-20 NOTE — Progress Notes (Signed)
UR completed Nichlas Pitera K. Le Ferraz, RN, BSN, [place], CCM  03/20/2014 4:52 PM

## 2014-03-20 NOTE — Progress Notes (Signed)
  Echocardiogram 2D Echocardiogram has been performed.  Stacy Pittman 03/20/2014, 10:52 AM

## 2014-03-20 NOTE — Procedures (Signed)
ELECTROENCEPHALOGRAM REPORT   Patient: Stacy Pittman       Room #: 7A12 EEG No. ID: [id number] Age: 80 y.o.        Sex: female Referring Physician: McDiarmid Report Date:  03/20/2014        Interpreting Physician: Alexis Goodell  History: [name] is an 80 y.o. female with new onset seizures  Medications:  Scheduled: . ALPRAZolam  0.5 mg Oral QHS  . aspirin  325 mg Oral Daily  . atorvastatin  10 mg Oral Daily  . levofloxacin (LEVAQUIN) IV  750 mg Intravenous Once  . [START ON 03/22/2014] levofloxacin (LEVAQUIN) IV  750 mg Intravenous Q48H  . mometasone-formoterol  2 puff Inhalation BID  . sodium chloride  500 mL Intravenous Once  . tiotropium  18 mcg Inhalation Daily  . Warfarin - Pharmacist Dosing Inpatient   Does not apply q1800    Conditions of Recording:  This is a 16 channel EEG carried out with the patient in the awake and drowsy state.  Description:  The waking background activity consists of a low voltage, symmetrical, fairly well organized, 9 Hz alpha activity, seen from the parieto-occipital and posterior temporal regions.  Low voltage fast activity, poorly organized, is seen anteriorly and is at times superimposed on more posterior regions.  A mixture of theta and alpha rhythms are seen from the central and temporal regions. The patient drowses with slowing to irregular, low voltage theta and beta activity.   Stage II sleep is not obtained. Hyperventilation was not performed.  Intermittent photic stimulation was performed but failed to illicit any change in the tracing.    IMPRESSION: This is a normal EEG.  No epileptiform activity is noted.   Alexis Goodell, MD [facility] [phone] 03/20/2014, 6:56 PM

## 2014-03-20 NOTE — Progress Notes (Signed)
Subjective:  No further episodes of passing.  Feeling nauseated all day. No other complaints.  Objective: Current vital signs: BP 91/53  Pulse 72  Temp(Src) 98.8 F (37.1 C) (Oral)  Resp 18  Wt 64.365 kg (141 lb 14.4 oz)  SpO2 93% Vital signs in last 24 hours: Temp:  [97.8 F (36.6 C)-99.7 F (37.6 C)] 98.8 F (37.1 C) (04/08 1655) Pulse Rate:  [64-81] 72 (04/08 1655) Resp:  [18-28] 18 (04/08 1655) BP: (91-116)/(35-70) 91/53 mmHg (04/08 1655) SpO2:  [91 %-100 %] 93 % (04/08 1655) FiO2 (%):  [3 %] 3 % (04/08 0004) Weight:  [64.365 kg (141 lb 14.4 oz)] 64.365 kg (141 lb 14.4 oz) (04/07 2007)  Intake/Output from previous day:   Intake/Output this shift:   Nutritional status: Dysphagia  Neurologic Exam: Mental Status: Alert, oriented, thought content appropriate.  Speech fluent without evidence of aphasia.  Able to follow 3 step commands without difficulty. Cranial Nerves: II:  Visual fields grossly normal, pupils equal, round, reactive to light and accommodation III,IV, VI: ptosis not present, extra-ocular motions intact bilaterally V,VII: smile asymmetric on the right due to scar, facial light touch sensation slightly decreased on the right VIII: hearing normal bilaterally IX,X: gag reflex present XI: bilateral shoulder shrug XII: midline tongue extension   Motor: Moving all extremities 4/5 strength Sensory: Pinprick and light touch intact throughout, bilaterally Deep Tendon Reflexes:  Right: Upper Extremity   Left: Upper extremity   biceps (C-5 to C-6) 2/4   biceps (C-5 to C-6) 2/4 tricep (C7) 2/4    triceps (C7) 2/4 Brachioradialis (C6) 2/4  Brachioradialis (C6) 2/4  Lower Extremity Lower Extremity  quadriceps (L-2 to L-4) 2/4   quadriceps (L-2 to L-4) 2/4 Achilles (S1) 0/4   Achilles (S1) 0/4  Plantars: Right: downgoing   Left: downgoing Cerebellar: Inexact finger-to-nose,      Lab Results: Basic Metabolic Panel:  Recent Labs Lab 04/04/2014 1651  03/20/14 0422  NA 140 139  K 4.3 5.3  CL 102 104  CO2 22 19  GLUCOSE 116* 126*  BUN 25* 26*  CREATININE 1.16* 1.09  CALCIUM 8.9 8.2*    Liver Function Tests:  Recent Labs Lab 03/18/2014 1651  AST 40*  ALT 24  ALKPHOS 80  BILITOT 0.5  PROT 6.9  ALBUMIN 3.2*   No results found for this basename: LIPASE, AMYLASE,  in the last 168 hours No results found for this basename: AMMONIA,  in the last 168 hours  CBC:  Recent Labs Lab 03/15/2014 1651 03/20/14 0422  WBC 14.4* 12.9*  HGB 11.3* 10.5*  HCT 37.3 34.4*  MCV 73.1* 72.6*  PLT 159 127*    Cardiac Enzymes:  Recent Labs Lab 03/20/2014 2048 03/20/14 0422 03/20/14 0800  TROPONINI <0.30 <0.30 <0.30    Lipid Panel:  Recent Labs Lab 03/20/14 0422  CHOL 94  TRIG 66  HDL 34*  CHOLHDL 2.8  VLDL 13  LDLCALC 47    CBG:  Recent Labs Lab 03/15/2014 2013 03/20/14 0807 03/20/14 1242 03/20/14 1648  GLUCAP 163* 125* 112* 120*    Microbiology: No results found for this or any previous visit.  Coagulation Studies:  Recent Labs  04/09/2014 1651 03/20/14 0422  LABPROT 16.4* 17.2*  INR 1.36 1.44    Imaging: Dg Chest 2 View  04/05/2014   CLINICAL DATA:  Syncope.  EXAM: CHEST  2 VIEW  COMPARISON:  None  FINDINGS: Moderate cardiac enlargement. No pleural effusion. Chronic interstitial coarsening noted bilaterally. Calcified atherosclerotic change  involves the thoracic aorta. No airspace consolidation. The bones appear osteopenic.  IMPRESSION: 1. Cardiac enlargement and chronic interstitial coarsening. 2. Atherosclerosis.   Electronically Signed   By: Kerby Moors M.D.   On: 03/16/2014 17:28   Ct Head Wo Contrast  03/13/2014   CLINICAL DATA:  Mental status changes.  EXAM: CT HEAD WITHOUT CONTRAST  TECHNIQUE: Contiguous axial images were obtained from the base of the skull through the vertex without intravenous contrast.  COMPARISON:  None.  FINDINGS: There is diffuse low attenuation throughout the subcortical and  periventricular white matter bilaterally compatible with chronic small vessel ischemic disease. There is prominence of the sulci and ventricles compatible with brain atrophy. No acute cortical infarct, hemorrhage, or mass lesion ispresent. No significant extra-axial fluid collection is present. There is mild mucosal thickening involving the posterior aspect of the right maxillary sinus. The paranasal sinuses are clear. The mastoid air cells are clear. The skull is intact.  IMPRESSION: 1. Small vessel ischemic disease and brain atrophy. 2. No acute intracranial abnormalities   Electronically Signed   By: Kerby Moors M.D.   On: 03/22/2014 17:21   Ct Angio Chest Pe W/cm &/or Wo Cm  03/20/2014   CLINICAL DATA:  Weakness and elevated D-dimer.  EXAM: CT ANGIOGRAPHY CHEST WITH CONTRAST  TECHNIQUE: Multidetector CT imaging of the chest was performed using the standard protocol during bolus administration of intravenous contrast. Multiplanar CT image reconstructions and MIPs were obtained to evaluate the vascular anatomy.  CONTRAST:  28mL OMNIPAQUE IOHEXOL 350 MG/ML SOLN  COMPARISON:  Chest radiograph performed 03/22/2014, and CTA of the chest performed 12/10/2013  FINDINGS: There is no evidence of pulmonary embolus.  Trace bilateral pleural effusions are noted. Bibasilar airspace opacification may reflect atelectasis or pneumonia. Underlying emphysema is noted bilaterally. Interstitial prominence is also noted; this could reflect minimal interstitial edema. There is no evidence of pneumothorax. No masses are identified; no abnormal focal contrast enhancement is seen.  Mild biatrial enlargement is noted. Diffuse coronary artery calcifications are seen. Calcification is seen along the thoracic aorta and proximal great vessels. No pericardial effusion is identified. No mediastinal lymphadenopathy is seen. No axillary lymphadenopathy is seen. Tiny nonspecific hypodensities are seen within the thyroid gland.  The visualized  portions of the liver and spleen are unremarkable.  No acute osseous abnormalities are seen.  Review of the MIP images confirms the above findings.  IMPRESSION: 1. No evidence of pulmonary embolus. 2. Trace bilateral pleural effusions noted. Bibasilar airspace opacification may reflect atelectasis or pneumonia. Interstitial prominence also seen; this could reflect minimal interstitial edema. 3. Underlying emphysema noted bilaterally. 4. Mild biatrial enlargement seen. 5. Diffuse coronary artery calcifications seen.   Electronically Signed   By: Garald Balding M.D.   On: 03/20/2014 03:37    Medications:  Scheduled: . ALPRAZolam  0.5 mg Oral QHS  . aspirin  325 mg Oral Daily  . atorvastatin  10 mg Oral Daily  . levofloxacin (LEVAQUIN) IV  750 mg Intravenous Once  . [START ON 03/22/2014] levofloxacin (LEVAQUIN) IV  750 mg Intravenous Q48H  . mometasone-formoterol  2 puff Inhalation BID  . sodium chloride  500 mL Intravenous Once  . tiotropium  18 mcg Inhalation Daily  . warfarin  3 mg Oral ONCE-1800  . Warfarin - Pharmacist Dosing Inpatient   Does not apply q1800    Assessment/Plan: Patient without further seizures.  MRI and EEG pending.    Recommendations: 1.  Will follow up results of above work up  2.  Continue seizure precautions    LOS: 1 day   Alexis Goodell, MD [facility] [phone]  03/20/2014  5:31 PM

## 2014-03-20 NOTE — H&P (Signed)
I have seen and examined this patient. I have discussed with Dr Caryl Bis.  I agree with their findings and plans as documented in their admission note.  Acute Issues 1. Syncope - Occurred soon after standing per patient. Family member prevented fall.  - Associated witnessed abnormal movement of limbs and transient left sided weakness. - Low BPs that improved with IVF boluses - Patient takes Xanax 0.5 mg tab, two tabs each night for sleep.  Pt denies running out of Xanax. - EKG: Atrial tachycardia  - Chest CTA showed bilateral basilar infiltrates.  - Chest CTA showed possible Interstitial edema - Head CT without acute abnormality - Leukocytosis  Differential includes : Seizure event with post-ictal transient Juley Giovanetti's paralysis, Cardiac related (mechanical, dysrhythmic), Hypovolemic, medication-ralated (Xanax, lisinopril, Furosemide), multifactorial  Plan: MRI brain EEG F/U neurology Service recommendations Seizure precautions Ativan 2 mg IV once as needed seizure Echocardiogram Telemetry 24 to 48 hours Levaquin for possible CAP Hold antihypertensive medications Gentle IVF hydration.

## 2014-03-20 NOTE — Progress Notes (Signed)
Family Practice Teaching Service Interval Progress Note  CTA without signs of PE, though did reveal trace effusions and atelectasis vs PNA. Given that patient meets SIRS criteria with elevated RR and elevated WBC with potential PNA on CTA I will start IV levaquin at this time. Patient lungs sound as though there are minimal crackles at the bases, though there are no wheezes. Could additionally consider treating with prednisone for a COPD exacerbation. At this time given that she is still hypotensive (95/37) I will give her another 500 cc bolus and follow-up her blood pressure after this bolus.  Tommi Rumps, MD Family Medicine PGY-2 Service Pager [phone]

## 2014-03-20 NOTE — Evaluation (Signed)
Physical Therapy Evaluation Patient Details Name: Stacy Pittman MRN: [id number] DOB: 1932-02-27 Today's Date: 03/20/2014   History of Present Illness  80 y.o. female admitted to [facility] on 04/05/2014 due to LOC at home with some TIA symptoms (L sided weakness) and jerking motions (as seen by family).  A family member was able to keep her from falling to the floor.  CTA revealed " effusions and atelectasis vs PNA"  There is no evidence of PE.  Pt with significant PMHx of A-fib, COPD (uses O2 at night and PRN based on systems at home 2 L O2 [place]), skin CA (s/p excision on her face), CHF, and HTN.    Clinical Impression  Pt is very weak and unable to attempt to walk today due to lightheadedness in sitting and standing.  Ortho static vitals were taken and were negative, however her BPs overall are not strong (see below).  O2 sats maintained on 3 L O2 [place].  At this level of weakness, pt is most appropriate for SNF level rehab at d/c.  If she improves and can walk with minimal assist, she could go home with home therapy and her daughter-in-law's assist.   PT to follow acutely for deficits listed below.     Follow Up Recommendations SNF    Equipment Recommendations  None recommended by PT    Recommendations for Other Services   NA    Precautions / Restrictions Precautions Precautions: Fall Precaution Comments: due to syncope.  She also uses RW at baseline      Mobility  Bed Mobility Overal bed mobility: Needs Assistance Bed Mobility: Supine to Sit;Sit to Supine     Supine to sit: Min assist Sit to supine: Min assist   General bed mobility comments: Min assist to help support trunk and transition hips to EOB.  Pt weak and fatigued.  She is relying heavily on bed railing for leverage.  Gravity assist to supine as well as momentum.  Min assist for leg to get back into bed.    Transfers Overall transfer level: Needs assistance Equipment used: Rolling walker (2 wheeled) Transfers: Sit to/from  Stand Sit to Stand: +2 safety/equipment;Min assist         General transfer comment: two person assist used for safety due to reports of lightheadedness in sitting, but ultimately not needed.  Min assist to support trunk during transitions from sit to stand.          Balance Overall balance assessment: Needs assistance Sitting-balance support: Feet supported;Bilateral upper extremity supported Sitting balance-Leahy Scale: Poor     Standing balance support: Bilateral upper extremity supported Standing balance-Leahy Scale: Poor                               Pertinent Vitals/Pain  03/20/14 1422  Vital Signs  Pulse Rate 71  BP ! 106/44 mmHg  BP Location Right arm  BP Method Automatic  Patient Position, if appropriate Lying  Pain Assessment  Pain Assessment No/denies pain  Oxygen Therapy  SpO2 91 %  O2 Device Nasal cannula  O2 Flow Rate (L/min) 3 L/min    03/20/14 1426  Vital Signs  Pulse Rate 81  BP ! 115/53 mmHg  BP Location Right arm  BP Method Automatic  Patient Position, if appropriate Sitting  Oxygen Therapy  SpO2 96 %    03/20/14 1428  Vital Signs  Pulse Rate 79  BP ! 103/43 mmHg  BP Location  Right arm  BP Method Automatic  Patient Position, if appropriate Standing  Oxygen Therapy  SpO2 100 %  O2 Device Nasal cannula  O2 Flow Rate (L/min) 3 L/min       Home Living Family/patient expects to be discharged to:: Private residence Living Arrangements: Children (son) Available Help at Discharge: Family Type of Home: House Home Access: Stairs to enter Entrance Stairs-Rails: None Entrance Stairs-Number of Steps: 3-4 Home Layout: One level Home Equipment: Environmental consultant - 2 wheels;Bedside commode;Tub bench (home O2)      Prior Function Level of Independence: Needs assistance   Gait / Transfers Assistance Needed: mod I with RW  ADL's / Homemaking Assistance Needed: assist needed to transfer and at times to help bathe in tub shower.     Comments: now has swallow precautions and altered diet (per speech therapy because she does not have her bottom set of dentures).          Extremity/Trunk Assessment   Upper Extremity Assessment: Defer to OT evaluation           Lower Extremity Assessment: Generalized weakness;RLE deficits/detail;LLE deficits/detail RLE Deficits / Details: 3+ to 4-/5 per supine MMT.  Functionally, no buckling LEs in standing.  No obvious asymmetries observed.  LLE Deficits / Details: 3+ to 4-/5 per supine MMT.  Functionally, no buckling LEs in standing.  No obvious asymmetries observed.   Cervical / Trunk Assessment: Kyphotic  Communication   Communication: Expressive difficulties (likely due to lack of teeth)  Cognition Arousal/Alertness: Lethargic Behavior During Therapy: WFL for tasks assessed/performed Overall Cognitive Status: No family/caregiver present to determine baseline cognitive functioning                               Assessment/Plan    PT Assessment Patient needs continued PT services  PT Diagnosis Difficulty walking;Abnormality of gait;Generalized weakness   PT Problem List Decreased strength;Decreased activity tolerance;Decreased balance;Decreased mobility;Decreased knowledge of use of DME;Cardiopulmonary status limiting activity  PT Treatment Interventions DME instruction;Gait training;Stair training;Functional mobility training;Therapeutic activities;Therapeutic exercise;Balance training;Neuromuscular re-education;Patient/family education   PT Goals (Current goals can be found in the Care Plan section) Acute Rehab PT Goals Patient Stated Goal: to get stronger PT Goal Formulation: With patient Time For Goal Achievement: 04/03/14 Potential to Achieve Goals: Good    Frequency Min 3X/week   Barriers to discharge   None         End of Session Equipment Utilized During Treatment: Gait belt Activity Tolerance: Patient limited by fatigue;Treatment limited  secondary to medical complications (Comment) (limited by lightheadedness in sitting and standing) Patient left: in bed;with call bell/phone within reach;with bed alarm set           Time: 0093-8182 PT Time Calculation (min): 33 min   Charges:   PT Evaluation $Initial PT Evaluation Tier I: 1 Procedure PT Treatments $Therapeutic Activity: 8-22 mins        Davis Ambrosini B. [place], [place], DPT [phone]   03/20/2014, 4:37 PM

## 2014-03-21 DIAGNOSIS — I63449 Cerebral infarction due to embolism of unspecified cerebellar artery: Secondary | ICD-10-CM

## 2014-03-21 LAB — URINALYSIS, ROUTINE W REFLEX MICROSCOPIC
Bilirubin Urine: NEGATIVE
GLUCOSE, UA: NEGATIVE mg/dL
HGB URINE DIPSTICK: NEGATIVE
Ketones, ur: NEGATIVE mg/dL
Nitrite: POSITIVE — AB
PH: 5.5 (ref 5.0–8.0)
PROTEIN: NEGATIVE mg/dL
Specific Gravity, Urine: 1.027 (ref 1.005–1.030)
Urobilinogen, UA: 0.2 mg/dL (ref 0.0–1.0)

## 2014-03-21 LAB — CBC
HCT: 33 % — ABNORMAL LOW (ref 36.0–46.0)
Hemoglobin: 10 g/dL — ABNORMAL LOW (ref 12.0–15.0)
MCH: 22.1 pg — AB (ref 26.0–34.0)
MCHC: 30.3 g/dL (ref 30.0–36.0)
MCV: 73 fL — AB (ref 78.0–100.0)
Platelets: 135 10*3/uL — ABNORMAL LOW (ref 150–400)
RBC: 4.52 MIL/uL (ref 3.87–5.11)
RDW: 21.7 % — AB (ref 11.5–15.5)
WBC: 14.4 10*3/uL — ABNORMAL HIGH (ref 4.0–10.5)

## 2014-03-21 LAB — GLUCOSE, CAPILLARY
GLUCOSE-CAPILLARY: 131 mg/dL — AB (ref 70–99)
Glucose-Capillary: 150 mg/dL — ABNORMAL HIGH (ref 70–99)
Glucose-Capillary: 124 mg/dL — ABNORMAL HIGH (ref 70–99)

## 2014-03-21 LAB — PROTIME-INR
INR: 1.41 (ref 0.00–1.49)
PROTHROMBIN TIME: 16.9 s — AB (ref 11.6–15.2)

## 2014-03-21 LAB — BASIC METABOLIC PANEL
BUN: 20 mg/dL (ref 6–23)
CO2: 20 meq/L (ref 19–32)
Calcium: 8.6 mg/dL (ref 8.4–10.5)
Chloride: 107 mEq/L (ref 96–112)
Creatinine, Ser: 0.85 mg/dL (ref 0.50–1.10)
GFR calc Af Amer: 73 mL/min — ABNORMAL LOW (ref >90)
GFR calc non Af Amer: 63 mL/min — ABNORMAL LOW (ref >90)
GLUCOSE: 139 mg/dL — AB (ref 70–99)
POTASSIUM: 4.4 meq/L (ref 3.7–5.3)
Sodium: 143 mEq/L (ref 137–147)

## 2014-03-21 LAB — RAPID URINE DRUG SCREEN, HOSP PERFORMED
AMPHETAMINES: NOT DETECTED
Barbiturates: NOT DETECTED
Benzodiazepines: POSITIVE — AB
COCAINE: NOT DETECTED
Opiates: NOT DETECTED
Tetrahydrocannabinol: NOT DETECTED

## 2014-03-21 LAB — URINE MICROSCOPIC-ADD ON

## 2014-03-21 MED ORDER — APIXABAN 5 MG PO TABS
5.0000 mg | ORAL_TABLET | Freq: Two times a day (BID) | ORAL | Status: DC
  Filled 2014-03-21 (×2): qty 1

## 2014-03-21 MED ORDER — APIXABAN 5 MG PO TABS
5.0000 mg | ORAL_TABLET | Freq: Two times a day (BID) | ORAL | Status: DC
  Administered 2014-03-21 – 2014-03-26 (×10): 5 mg via ORAL
  Filled 2014-03-21 (×12): qty 1

## 2014-03-21 MED ORDER — LEVOFLOXACIN 750 MG PO TABS
750.0000 mg | ORAL_TABLET | ORAL | Status: DC
  Administered 2014-03-21 – 2014-03-23 (×2): 750 mg via ORAL
  Filled 2014-03-21 (×3): qty 1

## 2014-03-21 MED ORDER — STROKE: EARLY STAGES OF RECOVERY BOOK
Freq: Once | Status: AC
  Administered 2014-03-22: 10:00:00
  Filled 2014-03-21: qty 1

## 2014-03-21 MED ORDER — APIXABAN 2.5 MG PO TABS: 2.5000 mg | ORAL_TABLET | Freq: Two times a day (BID) | ORAL | Status: DC

## 2014-03-21 MED ORDER — ATORVASTATIN CALCIUM 40 MG PO TABS
40.0000 mg | ORAL_TABLET | Freq: Every day | ORAL | Status: DC
  Administered 2014-03-21 – 2014-03-25 (×4): 40 mg via ORAL
  Filled 2014-03-21 (×6): qty 1

## 2014-03-21 MED ORDER — APIXABAN 2.5 MG PO TABS
2.5000 mg | ORAL_TABLET | Freq: Two times a day (BID) | ORAL | Status: DC
  Filled 2014-03-21 (×2): qty 1

## 2014-03-21 NOTE — Progress Notes (Signed)
Speech Language Pathology Treatment: Dysphagia  Patient Details Name: Stacy Pittman MRN: [id number] DOB: 04-10-1932 Today's Date: 03/21/2014 Time: 9528-4132 SLP Time Calculation (min): 15 min  Assessment / Plan / Recommendation Clinical Impression  Contacted by OT due to noted coughing with liquids intake which this SLP confirmed during diagnostic treatment this pm. Suspect a delayed swallow initiation due to oral deficits. 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 SLP cueing for use of chin tuck successful to eliminate overt indication of aspiration at bedside today. Moistened soft solids transited functionally. Educated patient on rationale for use of chin tuck. SLP will continue to f/u at bedside. Pending improvement in overall function 4/10, may benefit from [facility] for instrumental testing. Although patient reports speech is at baseline, recommend cognitive-linguistic evaluation to evaluate for potential subtle changes and need for f/u.    HPI HPI: Stacy Pittman is a 80 y.o. female presenting with loss of consciousness and TIA symptoms . PMH is significant for HTN, COPD, HLD, afib, and CHF. RN reported pt getting choked on liquids this morning.MRI shows bilateral cerebellar CVAs.       SLP Plan  Continue with current plan of care    Recommendations Diet recommendations: Dysphagia 3 (mechanical soft);Thin liquid Liquids provided via: Cup;No straw Medication Administration: Whole meds with liquid Supervision: Patient able to self feed;Full supervision/cueing for compensatory strategies Compensations: Slow rate;Small sips/bites Postural Changes and/or Swallow Maneuvers: Seated upright 90 degrees;Upright 30-60 min after meal;Chin tuck              General recommendations: Rehab consult Oral Care Recommendations: Oral care BID Follow up  Recommendations: Inpatient Rehab Plan: Continue with current plan of care    [address], CCC-SLP [phone]   Stacy Pittman 03/21/2014, 3:53 PM

## 2014-03-21 NOTE — Progress Notes (Signed)
Occupational Therapy Evaluation Patient Details Name: Stacy Pittman MRN: [id number] DOB: 1932-10-12 Today's Date: 03/21/2014    History of Present Illness 80 y.o. female admitted to [facility] on 03/15/2014 due to LOC at home with some TIA symptoms (L sided weakness) and jerking motions (as seen by family).  A family member was able to keep her from falling to the floor.  CTA revealed " effusions and atelectasis vs PNA"   Pt with significant PMHx of A-fib, COPD (uses O2 at night and PRN), skin CA (s/p excision on her face), CHF, and HTN.  MRI 4/8- Extensive infarction throughout the cerebellum, mostly in the region of the superior cerebellum but with some involvement inferiorly on the right.   Clinical Impression   PTA, pt lived at home with family and was mod I with ADL and functional mobility for ADL @ RW level. Family supervised with shower transfers only. Pt presents with significant ataxia and generalized weakness. Currently pt requires mod A with UB ADL and Max A with LB ADL and Max A with transfers. Pt is an excellent CIR candidate. Very supportive family and pt is motivated to return to PLOF. Began education on maintaining visual target during mobility to reduce "feelings of dizziness" during mobility.  Pt will benefit from skilled OT services to facilitate D/C to CIR due to below deficits.  Pt coughing with thin liquids - notified nsg and speech.    Follow Up Recommendations  CIR;Supervision/Assistance - 24 hour    Equipment Recommendations  None recommended by OT    Recommendations for Other Services Rehab consult Rescreen by speech due to coughing with thin liquids     Precautions / Restrictions Precautions Precautions: Fall Precaution Comments: ataxia Restrictions Weight Bearing Restrictions: No      Mobility Bed Mobility Overal bed mobility: Needs Assistance Bed Mobility: Sidelying to Sit   Sidelying to sit: Mod assist          Transfers Overall transfer level: Needs  assistance   Transfers: Sit to/from Stand;Stand Pivot Transfers Sit to Stand: Mod assist Stand pivot transfers: Max assist       General transfer comment: pt kept close to body to increase stability    Balance Overall balance assessment: Needs assistance Sitting-balance support: Feet supported;Bilateral upper extremity supported Sitting balance-Leahy Scale: Fair     Standing balance support: Bilateral upper extremity supported Standing balance-Leahy Scale: Zero                              ADL Overall ADL's : Needs assistance/impaired Eating/Feeding: Minimal assistance;Sitting Eating/Feeding Details (indicate cue type and reason): Pt coughing after drinking small sip of water. speech notified. Pt has difficulty controlling motor pattern to feed self Grooming: Moderate assistance;Sitting Grooming Details (indicate cue type and reason): limited by ataxia Upper Body Bathing: Moderate assistance;Sitting   Lower Body Bathing: Maximal assistance;Sit to/from stand   Upper Body Dressing : Moderate assistance;Sitting   Lower Body Dressing: Maximal assistance;Sit to/from stand   Toilet Transfer: Maximal assistance;Stand-pivot   Toileting- Clothing Manipulation and Hygiene: Maximal assistance;Sit to/from stand       Functional mobility during ADLs: Maximal assistance (for trasnfers. Will need +2 for ambulation) General ADL Comments: significantly impacted by ataxia     Vision  wears glasses for reading. C/o blurred vision               Additional Comments: Most likely havign difficulty controlled coordination of eye movements  Perception     Praxis      Pertinent Vitals/Pain O2 94 2 L     Hand Dominance Right   Extremity/Trunk Assessment Upper Extremity Assessment Upper Extremity Assessment: RUE deficits/detail;LUE deficits/detail RUE Deficits / Details: significant ataix - R>L; generalized weakness. difficulty using as dominant UE due to  ataxia RUE Coordination: decreased fine motor;decreased gross motor LUE Deficits / Details: ataxia. using as dominant UE; generalized weakness LUE Coordination: decreased fine motor;decreased gross motor   Lower Extremity Assessment Lower Extremity Assessment: Defer to PT evaluation   Cervical / Trunk Assessment Cervical / Trunk Assessment: Kyphotic;Other exceptions Cervical / Trunk Exceptions: trunkal ataxia   Communication Communication Communication: Expressive difficulties (dysarthria)   Cognition Arousal/Alertness: Awake/alert Behavior During Therapy: WFL for tasks assessed/performed Overall Cognitive Status: No family/caregiver present to determine baseline cognitive functioning (appears baseline)                     General Comments       Exercises Exercises: Other exercises Other Exercises Other Exercises: Pt educated on need to use a focal point during mobility   Shoulder Instructions      Home Living Family/patient expects to be discharged to:: Inpatient rehab Living Arrangements: Children Available Help at Discharge: Family Type of Home: House Home Access: Stairs to enter [employer] of Steps: 3-4 Entrance Stairs-Rails: None Home Layout: One level               Home Equipment: Environmental consultant - 2 wheels;Bedside commode;Tub bench          Prior Functioning/Environment Level of Independence: Needs assistance  Gait / Transfers Assistance Needed: mod I with RW ADL's / Homemaking Assistance Needed: Pt mod I with ADL. Family S for bathing/tub transfers Communication / Swallowing Assistance Needed: none      OT Diagnosis: Generalized weakness;Disturbance of vision;Ataxia   OT Problem List: Decreased strength;Decreased activity tolerance;Impaired balance (sitting and/or standing);Impaired vision/perception;Decreased coordination;Cardiopulmonary status limiting activity;Impaired UE functional use   OT Treatment/Interventions: Self-care/ADL  training;Therapeutic exercise;Neuromuscular education;Energy conservation;DME and/or AE instruction;Therapeutic activities;Visual/perceptual remediation/compensation;Patient/family education;Balance training    OT Goals(Current goals can be found in the care plan section) Acute Rehab OT Goals Patient Stated Goal: to be able to take care of myself OT Goal Formulation: With patient Time For Goal Achievement: 04/04/14 Potential to Achieve Goals: Good  OT Frequency: Min 3X/week   Barriers to D/C:            Co-evaluation              End of Session Equipment Utilized During Treatment: Gait belt Nurse Communication: Mobility status;Other (comment) (concerns over coughing with thin liquids)  Activity Tolerance: Patient tolerated treatment well Patient left: in chair;with call bell/phone within reach;with chair alarm set   Time: [phone] OT Time Calculation (min): 35 min Charges:  OT General Charges $OT Visit: 1 Procedure OT Evaluation $Initial OT Evaluation Tier I: 1 Procedure OT Treatments $Self Care/Home Management : 23-37 mins G-Codes:    Roney Jaffe Jacobb Alen 04-05-14, 11:02 AM   Maurie Boettcher, OTR/L  [phone] April 05, 2014

## 2014-03-21 NOTE — Clinical Social Work Placement (Signed)
Clinical Social Work [place] NOTE    Patient: Stacy Pittman  Account Number: [id number] Admit date: 31-Mar-2014  Clinical Social Worker: Rhea Pink LCSWA Date/time: 03/21/2014 10:30 AM  Clinical Social Work is seeking post-discharge placement for this patient at the following level of care: SKILLED NURSING (*CSW will update this form in Epic as items are completed)  4/9/2015Patient/family provided with [place] Department of Clinical Social Work's list of facilities offering this level of care within the geographic area requested by the patient (or if unable, by the patient's family).  03/21/2014 Patient/family informed of their freedom to choose among providers that offer the needed level of care, that participate in Medicare, Medicaid or managed care program needed by the patient, have an available bed and are willing to accept the patient.  03/21/2014 Patient/family informed of [facility]' ownership interest in [facility], as well as of the fact that they are under no obligation to receive care at this facility.  PASARR submitted to EDS on Pre-exisitng PASARR number received from EDS on  FL2 transmitted to all facilities in geographic area requested by pt/family on 03/21/2014 FL2 transmitted to all facilities within larger geographic area on  Patient informed that his/her managed care company has contracts with or will negotiate with certain facilities, including the following:  Patient/family informed of bed offers received: Patient chooses bed at  Physician recommends and patient chooses bed at  Patient to be transferred to on  Patient to be transferred to facility by  The following physician request were entered in Epic:  Additional Comments:

## 2014-03-21 NOTE — Discharge Instructions (Signed)
Information on my medicine - ELIQUIS® (apixaban) ° °This medication education was reviewed with me or my healthcare representative as part of my discharge preparation.  The pharmacist that spoke with me during my hospital stay was:  Maidie Streight P Keymani Mclean, RPH ° °Why was Eliquis® prescribed for you? °Eliquis® was prescribed for you to reduce the risk of a blood clot forming that can cause a stroke if you have a medical condition called atrial fibrillation (a type of irregular heartbeat). ° °What do You need to know about Eliquis® ? °Take your Eliquis® TWICE DAILY - one tablet in the morning and one tablet in the evening with or without food. If you have difficulty swallowing the tablet whole please discuss with your pharmacist how to take the medication safely. ° °Take Eliquis® exactly as prescribed by your doctor and DO NOT stop taking Eliquis® without talking to the doctor who prescribed the medication.  Stopping may increase your risk of developing a stroke.  Refill your prescription before you run out. ° °After discharge, you should have regular check-up appointments with your healthcare provider that is prescribing your Eliquis®.  In the future your dose may need to be changed if your kidney function or weight changes by a significant amount or as you get older. ° °What do you do if you miss a dose? °If you miss a dose, take it as soon as you remember on the same day and resume taking twice daily.  Do not take more than one dose of ELIQUIS at the same time to make up a missed dose. ° °Important Safety Information °A possible side effect of Eliquis® is bleeding. You should call your healthcare provider right away if you experience any of the following: °  Bleeding from an injury or your nose that does not stop. °  Unusual colored urine (red or dark brown) or unusual colored stools (red or black). °  Unusual bruising for unknown reasons. °  A serious fall or if you hit your head (even if there is no bleeding). ° °Some  medicines may interact with Eliquis® and might increase your risk of bleeding or clotting while on Eliquis®. To help avoid this, consult your healthcare provider or pharmacist prior to using any new prescription or non-prescription medications, including herbals, vitamins, non-steroidal anti-inflammatory drugs (NSAIDs) and supplements. ° °This website has more information on Eliquis® (apixaban): www.Eliquis.com. ° °

## 2014-03-21 NOTE — Progress Notes (Signed)
Physical Therapy Treatment Patient Details Name: Stacy Pittman MRN: [id number] DOB: 01/30/1932 Today's Date: 03/21/2014    History of Present Illness 80 y.o. female admitted to [facility] on Apr 09, 2014 due to LOC at home with some TIA symptoms (L sided weakness) and jerking motions (as seen by family).  A family member was able to keep her from falling to the floor.  CTA revealed " effusions and atelectasis vs PNA"  There is no evidence of PE.  Pt with significant PMHx of A-fib, COPD (uses O2 at night and PRN based on systems at home 2 L O2 [place]), skin CA (s/p excision on her face), CHF, and HTN.  MRI revealed extensive infarction throughout the cerebellum, mostly in the region of the superior cerebellum but with some involvement inferiorly on the right.    PT Comments    Pt is progressing well with mobility despite feeling so ill when she moves about.  She is responding positively to target compensation strategies and tolerated pre gait activities in standing today with one person assist.  Significant ataxia throughout all 4 extremities and trunk. I am changing my recommendation to CIR as pt is showing better ability to tolerate longer sessions today. She is very motivated to get back to her former level of independence.       Follow Up Recommendations  CIR     Equipment Recommendations  None recommended by PT    Recommendations for Other Services Rehab consult     Precautions / Restrictions Precautions Precautions: Fall Precaution Comments: ataxia    Mobility  Bed Mobility Overal bed mobility: Needs Assistance Bed Mobility: Supine to Sit;Sit to Supine     Supine to sit: Mod assist;HOB elevated Sit to supine: Min assist   General bed mobility comments: Mod assist to support trunk and help legs to EOB during initiation of going to EOB.  Min assist of right leg to get back in bed.  Pt using gravity to pull trunk into bed.    Transfers Overall transfer level: Needs assistance Equipment  used: Rolling walker (2 wheeled) Transfers: Sit to/from Stand Sit to Stand: Min assist         General transfer comment: Min assist to get to standing from mildly elevated bed.  Pt relying heavily on upper extremity support and legs pressed against the lower railing of the bed for stability during transitions sit <-> stand.  Pt helping pt with target focusing to control her dizziness and trunk ataxia (the more she focuses on a target, the less sway she has at her trunk)  Ambulation/Gait             General Gait Details: two person assist needed to attempt safely.         Modified Rankin (Stroke Patients Only) Modified Rankin (Stroke Patients Only) Pre-Morbid Rankin Score: Moderate disability Modified Rankin: Severe disability     Balance Overall balance assessment: Needs assistance Sitting-balance support: Feet supported;Bilateral upper extremity supported Sitting balance-Leahy Scale: Fair Sitting balance - Comments: Pt is able to let go of bil upper extremity support and keep her balance EOB, however, her sway increases without arms to support her.  She was able to decrease sway by finding and focusing on a target in the room.  PT brought some more targets "A"s to hang around room to help.     Standing balance support: Bilateral upper extremity supported Standing balance-Leahy Scale: Poor Standing balance comment: Pt is able to stand against the bed, but has to have  bil upper extremity support and the support of therapist to prevent falls. Worked in standing on pre gait activites (stepping forward and back with each foot holding to the RW.  Up to mod assist of trunk to prevent LOB during pre gait activities.  Bil LE ataxia with stepping.                     Cognition Arousal/Alertness: Awake/alert Behavior During Therapy: WFL for tasks assessed/performed Overall Cognitive Status: No family/caregiver present to determine baseline cognitive functioning                              Pertinent Vitals/Pain See vitals flow sheet.            PT Goals (current goals can now be found in the care plan section) Acute Rehab PT Goals Patient Stated Goal: Pt wants to do what she has to do to get better.  Progress towards PT goals: Progressing toward goals    Frequency  Min 4X/week    PT Plan Discharge plan needs to be updated       End of Session Equipment Utilized During Treatment: Gait belt Activity Tolerance: Patient limited by fatigue;Other (comment) (limited by dizziness) Patient left: in bed;with call bell/phone within reach;with bed alarm set     Time: 1219-7588 PT Time Calculation (min): 22 min  Charges:  $Therapeutic Activity: 8-22 mins                       Jamichael Knotts B. [place], [place], DPT [phone]   03/21/2014, 4:22 PM

## 2014-03-21 NOTE — Progress Notes (Signed)
I have seen and examined this patient. I have discussed with Dr Berkley Harvey.  I agree with their findings and plans as documented in their progress note.

## 2014-03-21 NOTE — Progress Notes (Signed)
Family Medicine Teaching Service Daily Progress Note Intern Pager: [phone]  Patient name: Stacy Pittman Medical record number: [id number] Date of birth: 1932-10-02 Age: 80 y.o. Gender: female  Primary Care Provider: Margarita Rana, MD Consultants: None Code Status: DNR  Pt Overview and Major Events to Date:  4/7: LOC w/ seizure-like activity and left side weakness 4/8: Extensive infarction throughout the cerebellum  Assessment and Plan: Stacy Pittman is a 80 y.o. female presenting with loss of consciousness and TIA symptoms . PMH is significant for HTN, COPD, HLD, afib, and CHF.   # CVA w/ seizure-like activity - EEG: normal, no epileptiform  - MRI/MRA   Extensive infarction throughout the cerebellum, mostly in the region of the superior cerebellum but with some involvement inferiorly on the right.   Swelling with mass effect upon the fourth ventricle but no ventricular obstruction at this moment. No sign of hemorrhage.   Extensive chronic small vessel disease throughout the cerebral hemispheric white matter.   Chronic right ICA occlusion. Left ICA sufficiently patent. Both vertebral arteries are patent.  -neuro consulted f/u their recs   Switching to Eliquis  - ECHO  akinesis of the apex and distal septal wall. The Study is inadequate to rule out apical thrombus by this study. Recommend Cardiac MRI to further delineate for LV thrombus  EF 07-37%, Diastolic dysfcn grade 3  Pulmonary HTN: PA pressure = 57 -PT recs: SNF; OT recs CIR -SLP: Dysphagia 3 (mechanical soft, thin liquid) -ASA 325 mg, Lipitor inc to 40 mg qhs - On Xanax 0.5mg  TID; Will discuss tapering off with team  # Leukocytosis: No signs of infection on exam. CXR possible PNA. Abd soft and non-tender. Has been afebrile. If the patient had a seizure this may be a reactive leukocytosis.  - WBC to 14.4 > 12.9.  - Check UA - Levaquin (4/9 >>  # Anemia: Hgb 11.3 on admission. No prior values noted. This is  microcytic. No mention of bleeding on interview.  -Hgb 10 (4/9) < 11.3 on 4/7 -monitor for bleeding  - Checking FOBT - Will start PO iron on discharge  # AKI on CKD: Cr 1.16 with GFR 43. No mention of kidney disease on discussion with family.  -will trend creatinine  -hold nephrotoxic agents until trend established   # COPD: on home O2 2L [place]. At baseline O2 use. Patient uses Advair and albuterol at home - Dulera BID - Home Spiriva daily - albuterol q6hrs PRN for wheezing and shortness of breath -monitor respiratory status   # Afib: rate controlled in the ED.  -will continue home amiodarone  -warfarin per pharmacy, spoke with pharmacy regarding bridging and she stated that they do not bridge patients that have suffered a stroke vs TIA due to risk of hemorrhagic transformation, and states that typically the stroke team just resumes the patients home anticoagulation, so will resume coumadin at this time   FEN/GI: Dys 3 Diet; NS @ 75 Prophylaxis: On Eliquis  Disposition: pending neuro recommendations  Subjective:  Complaints of some lightheadedness. Shortness of breath is baseline. otherwise has been doing well overnight.  Objective: Temp:  [97.8 F (36.6 C)-100.4 F (38 C)] 100.4 F (38 C) (04/09 0000) Pulse Rate:  [70-81] 80 (04/09 0000) Resp:  [18] 18 (04/09 0000) BP: (91-116)/(32-59) 93/32 mmHg (04/09 0000) SpO2:  [91 %-100 %] 91 % (04/09 0000)  Physical Exam: General: Laying in bed, in no acute distress Cardiovascular: Regular rate/rhythm, no murmurs, distal pulses intact Respiratory:CTAB, no increased work of breathing  Abdomen: soft, non-tender, non-distended Extremities: warm, no calf tenderness  Laboratory:  Recent Labs Lab 04/04/2014 1651 03/20/14 0422  WBC 14.4* 12.9*  HGB 11.3* 10.5*  HCT 37.3 34.4*  PLT 159 127*    Recent Labs Lab 03/18/2014 1651 03/20/14 0422  NA 140 139  K 4.3 5.3  CL 102 104  CO2 22 19  BUN 25* 26*  CREATININE 1.16* 1.09   CALCIUM 8.9 8.2*  PROT 6.9  --   BILITOT 0.5  --   ALKPHOS 80  --   ALT 24  --   AST 40*  --   GLUCOSE 116* 126*   Cardiac Panel (last 3 results)  Recent Labs  04/03/2014 2048 03/20/14 0422 03/20/14 0800  TROPONINI <0.30 <0.30 <0.30   D-Dimer: 0.70  Imaging/Diagnostic Tests:  Dg Chest 2 View  03/21/2014 IMPRESSION: 1. Cardiac enlargement and chronic interstitial coarsening. 2. Atherosclerosis.   Electronically Signed   By: Kerby Moors M.D.   On: 03/21/2014 17:28   Ct Head Wo Contrast  04/09/2014 IMPRESSION: 1. Small vessel ischemic disease and brain atrophy. 2. No acute intracranial abnormalities   Electronically Signed   By: Kerby Moors M.D.   On: 03/20/2014 17:21   Ct Angio Chest Pe W/cm &/or Wo Cm  03/20/2014 IMPRESSION: 1. No evidence of pulmonary embolus. 2. Trace bilateral pleural effusions noted. Bibasilar airspace opacification may reflect atelectasis or pneumonia. Interstitial prominence also seen; this could reflect minimal interstitial edema. 3. Underlying emphysema noted bilaterally. 4. Mild biatrial enlargement seen. 5. Diffuse coronary artery calcifications seen.   Electronically Signed   By: Garald Balding M.D.   On: 03/20/2014 03:37   MRI HEAD FINDINGS 03/20/14 There are large regions of acute infarction within the superior  cerebellum on both sides. Some involvement of the inferior  cerebellum is present on the right. The region of infarction shows  swelling with mass effect upon the fourth ventricle. Ventricular  size is unchanged at this moment, but with further swelling there  could be obstructive hydrocephalus. No acute infarction in the  supratentorial brain. There are extensive chronic small vessel  changes throughout the white matter. No large vessel territory supra  tentorial infarction. No mass lesion. No extra-axial collection. No  pituitary mass. No inflammatory sinus disease. No skull or skullbase  lesion.  MRA HEAD FINDINGS  The right  internal carotid artery is occluded to the level of the  skullbase. Left internal carotid artery shows atherosclerotic  irregularity in the carotid siphon region but no stenosis. The left  anterior and middle cerebral vessels show mild atherosclerotic  irregularity but are widely patent. Patent anterior communicating  artery give supply to the anterior circulation on the right, and  there is fairly normal visualization of the right anterior and  middle cerebral artery. Also contributing is a patent posterior  communicating artery on this side.  Both vertebral arteries are patent with the left being diminutive in  the right being dominant. No basilar stenosis. Left PICA is patent.  Right PICA not demonstrated. Anterior inferior cerebellar arteries  are present bilaterally. There is some flow in the superior  cerebellar arteries bilaterally. Both posterior cerebral arteries  show flow and good distal opacification.  IMPRESSION:  Extensive infarction throughout the cerebellum, mostly in the region  of the superior cerebellum but with some involvement inferiorly on  the right. Swelling with mass effect upon the fourth ventricle but  no ventricular obstruction at this moment. No sign of hemorrhage.  Extensive chronic small  vessel disease throughout the cerebral  hemispheric white matter.  Chronic right ICA occlusion. Left ICA sufficiently patent with  atherosclerotic irregularity in the siphon region. Good flow  demonstrated in both the anterior and middle cerebral artery  territories because of patent anterior and posterior communicating  arteries.  Both vertebral arteries are patent. All major posterior circulation  branches are demonstrated presently with the exception of right  PICA. This includes flow within both superior cerebellar arteries at  this time.   Phill Myron, MD 03/21/2014, 7:41 AM PGY-1, [place] Intern pager: [phone], text pages welcome

## 2014-03-21 NOTE — Progress Notes (Signed)
I discussed with  Dr Nettey.  I agree with their plans documented in their progress note for today.  

## 2014-03-21 NOTE — Progress Notes (Signed)
Rehab Admissions Coordinator Note:  Patient was screened by Cleatrice Burke for appropriateness for an Inpatient Acute Rehab Consult.  At this time, we are recommending Inpatient Rehab consult.  Audelia Acton [facility] 03/21/2014, 11:58 AM  I can be reached at [phone].

## 2014-03-21 NOTE — Progress Notes (Signed)
Stroke Team Progress Note  HISTORY Stacy Pittman is a 80 y.o. 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 Per the referring physician, the patient was in the kitchen and became lightheaded. The patient states that she remembers becoming lightheaded, but then feels that she passed out for a period of time. She does remember EMS arriving at her house, and feels that she was confused. She continues to be very soft spoken and sleepy.  She initially was reported to have left hemiparesis, but this has rapidly improved.She has a mildly disconjugate gaze which the granddaughter reports is new. She presented to eh ED 03/24/2014 at 1636. She was LKW was unclear. Patient was not administered TPA secondary to seizure onset,stroke was not initially suspected, rapidly improving symptoms. She was admitted for further evaluation and treatment.  SUBJECTIVE No family is at the bedside.  Overall she feels her condition is stable.   OBJECTIVE Most recent Vital Signs: Filed Vitals:   03/20/14 2119 03/21/14 0000 03/21/14 0422 03/21/14 0840  BP: 97/59 93/32 122/55   Pulse: 78 80 82 80  Temp: 99.1 F (37.3 C) 100.4 F (38 C) 99.9 F (37.7 C)   TempSrc: Oral Oral Oral   Resp: 18 18 18 18   Weight:      SpO2: 93% 91% 91% 91%   CBG (last 3)   Recent Labs  03/20/14 1648 03/20/14 2121 03/21/14 0738  GLUCAP 120* 230* 150*    IV Fluid Intake:   . sodium chloride 75 mL/hr at 03/20/14 1535    MEDICATIONS  . ALPRAZolam  0.5 mg Oral QHS  . aspirin  325 mg Oral Daily  . atorvastatin  10 mg Oral Daily  . levofloxacin (LEVAQUIN) IV  750 mg Intravenous Once  . [START ON 03/22/2014] levofloxacin (LEVAQUIN) IV  750 mg Intravenous Q48H  . mometasone-formoterol  2 puff Inhalation BID  . sodium chloride  500 mL Intravenous Once  . tiotropium  18 mcg Inhalation Daily  . Warfarin - Pharmacist Dosing Inpatient   Does not apply q1800   PRN:   albuterol  Diet:  Dysphagia 3 thin liquids Activity:   Bathroom privileges with assistance DVT Prophylaxis:  warfarin  CLINICALLY SIGNIFICANT STUDIES Basic Metabolic Panel:   Recent Labs Lab 03/17/2014 1651 03/20/14 0422  NA 140 139  K 4.3 5.3  CL 102 104  CO2 22 19  GLUCOSE 116* 126*  BUN 25* 26*  CREATININE 1.16* 1.09  CALCIUM 8.9 8.2*   Liver Function Tests:   Recent Labs Lab 03/15/2014 1651  AST 40*  ALT 24  ALKPHOS 80  BILITOT 0.5  PROT 6.9  ALBUMIN 3.2*   CBC:   Recent Labs Lab 03/20/14 0422 03/21/14 0700  WBC 12.9* 14.4*  HGB 10.5* 10.0*  HCT 34.4* 33.0*  MCV 72.6* 73.0*  PLT 127* 135*   Coagulation:   Recent Labs Lab 03/25/2014 1651 03/20/14 0422 03/21/14 0700  LABPROT 16.4* 17.2* 16.9*  INR 1.36 1.44 1.41   Cardiac Enzymes:   Recent Labs Lab 03/22/2014 2048 03/20/14 0422 03/20/14 0800  TROPONINI <0.30 <0.30 <0.30   Urinalysis: No results found for this basename: COLORURINE, APPERANCEUR, LABSPEC, PHURINE, GLUCOSEU, HGBUR, BILIRUBINUR, KETONESUR, PROTEINUR, UROBILINOGEN, NITRITE, LEUKOCYTESUR,  in the last 168 hours Lipid Panel    Component Value Date/Time   CHOL 94 03/20/2014 0422   TRIG 66 03/20/2014 0422   HDL 34* 03/20/2014 0422   CHOLHDL 2.8 03/20/2014  0422   VLDL 13 03/20/2014 0422   LDLCALC 47 03/20/2014 0422   HgbA1C  Lab Results  Component Value Date   HGBA1C 6.2* 31-Mar-2014    Urine Drug Screen:   No results found for this basename: labopia,  cocainscrnur,  labbenz,  amphetmu,  thcu,  labbarb    Alcohol Level:   Recent Labs Lab 03/31/2014 2048  ETH <11    CT of the brain  03-31-2014    1. Small vessel ischemic disease and brain atrophy. 2. No acute intracranial abnormalities     MRI of the brain  03/20/2014   Extensive infarction throughout the cerebellum, mostly in the region of the superior cerebellum but with some involvement inferiorly on the right. Swelling with mass effect upon the fourth ventricle but no ventricular  obstruction at this moment. No sign of hemorrhage.  Extensive chronic small vessel disease throughout the cerebral hemispheric white matter  MRA of the brain  03/20/2014   Chronic right ICA occlusion. Left ICA sufficiently patent with atherosclerotic irregularity in the siphon region. Good flow demonstrated in both the anterior and middle cerebral artery territories because of patent anterior and posterior communicating arteries.  Both vertebral arteries are patent. All major posterior circulation branches are demonstrated presently with the exception of right PICA. This includes flow within both superior cerebellar arteries at this time.     2D Echocardiogram  Endocardial segments are poorly visualized  but there is a suggestion of akinesis of the apex and distal septal wall. The Study is inadequate to rule out apical thrombus by this study. EF 45-50%. The right ventricular systolic pressure was increased consistent with moderate pulmonary hypertension.  Carotid Doppler    CXR  03-31-2014   1. Cardiac enlargement and chronic interstitial coarsening. 2. Atherosclerosis.     CT Angio Chest  03/20/2014   1. No evidence of pulmonary embolus. 2. Trace bilateral pleural effusions noted. Bibasilar airspace opacification may reflect atelectasis or pneumonia. Interstitial prominence also seen; this could reflect minimal interstitial edema. 3. Underlying emphysema noted bilaterally. 4. Mild biatrial enlargement seen. 5. Diffuse coronary artery calcifications seen.     EEG  This is a normal EEG. No epileptiform activity is noted.   EKG  Ectopic atrial tachycardia. For complete results please see formal report.   Therapy Recommendations SNF  Physical Exam   Pleasant caucasian lady not in distress.Awake alert. Afebrile. Head is nontraumatic. Neck is supple without bruit. Hearing is normal. Cardiac exam no murmur or gallop. Lungs are clear to auscultation. Distal pulses are well felt. Neurological Exam ;  Awake   Alert oriented x 3. Normal speech and language.eye movements full without nystagmus.fundi were not visualized. Vision acuity and fields appear normal. Hearing is normal. Palatal movements are normal. Face symmetric. Tongue midline. Normal strength, tone, reflexes and coordination. Normal sensation. Gait deferred. ASSESSMENT Ms. Stacy Pittman is a 80 y.o. female presenting with syncope, loss of consciousness as well as left hemiparesis and dysconjugate gaze. 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 Infarcts felt to be embolic secondary to known atrial fibrillation.  On warfarin prior to admission with INR subtherapeutic at 1.36 on arrival. Now on warfarin for secondary stroke prevention.   atrial fibrillation, on coumadin prior to admission hypertension Hyperlipidemia, LDL 47, on lipitor 10 mg PTA, now on lipitor 40 mg, at goal LDL < 100 CHF COPD, home O2  Hospital day # 2  TREATMENT/PLAN  Recommend change coumadin to one of the  new oral anticoagulants as INR was subtherapeutic on arrival,  for secondary stroke prevention given atrial fibrillation diagnosis.  No indication for cardiac MRI. Anticoagulation is already patient indicated treatment plan.  Recommend carotid doppler to complete vascular workup for potential stroke source  Dr. Leonie Man discussed diagnosis, prognosis,  treatment options and plan of care with FP team.   Burnetta Sabin, MSN, RN, ANVP-BC, AGPCNP-BC [facility] Pager: [phone] 03/21/2014 9:26 AM  I have personally obtained a history, examined the patient, evaluated imaging results, and formulated the assessment and plan of care. I agree with the above.  Antony Contras, MD  To contact Stroke Continuity provider, please refer to [email or web address] After hours, contact General Neurology

## 2014-03-21 NOTE — Clinical Social Work Psychosocial (Signed)
Clinical Social Work Department  BRIEF PSYCHOSOCIAL ASSESSMENT  Patient: Stacy Pittman  Account Number: [id number]  Admit date: 04/11/2014 Clinical Social Worker Rhea Pink, MSW Date/Time: 03/21/2014  Referred by: Physician Date Referred: 03/15/2014 Referred for   SNF Placement   Other Referral:  Interview type: Patient  Other interview type: PSYCHOSOCIAL DATA  Living Status: Patient lives with her Children Admitted from facility:  Level of care:  Primary support name: Cortana Vanderford Primary support relationship to patient: Son Degree of support available:  Strong and vested according to the patient   CURRENT CONCERNS  Current Concerns   Post-Acute Placement   Other Concerns:  SOCIAL WORK ASSESSMENT / PLAN  CSW met with pt at bedside to offer support and discuss SNF placement. Patient reported that she does not want to go to a facility and wants to go back home with her children. Patient was very weak though, which CSW pointed out. Patient then reported that if she has to go to a facility she would like to go to [facility]. Patient was too weak to change the channel on the TV. CSW helped her change the channel to the price is right. Patient thanked CSW and expressed her appreciation.         CSW completed FL2 and sent clinicals to [facility]- Patient has been there in the past..     Assessment/plan status: Information/Referral to [employer]  Other assessment/ plan:  Information/referral to community resources:  SNF   PTAR  PATIENT'S/FAMILY'S RESPONSE TO PLAN OF CARE:  Pt is not agreeable to SNF at this time but may be agreeable if she has to go.   Pt verbalized understanding of placement process and expressed her  appreciation for CSW assist.   Rhea Pink, MSW, [place]

## 2014-03-22 ENCOUNTER — Inpatient Hospital Stay (HOSPITAL_COMMUNITY): Payer: Medicare HMO

## 2014-03-22 ENCOUNTER — Other Ambulatory Visit: Payer: Self-pay

## 2014-03-22 DIAGNOSIS — I6330 Cerebral infarction due to thrombosis of unspecified cerebral artery: Secondary | ICD-10-CM

## 2014-03-22 LAB — BASIC METABOLIC PANEL
BUN: 18 mg/dL (ref 6–23)
CALCIUM: 8.7 mg/dL (ref 8.4–10.5)
CHLORIDE: 108 meq/L (ref 96–112)
CO2: 19 mEq/L (ref 19–32)
Creatinine, Ser: 0.89 mg/dL (ref 0.50–1.10)
GFR calc Af Amer: 69 mL/min — ABNORMAL LOW (ref >90)
GFR calc non Af Amer: 60 mL/min — ABNORMAL LOW (ref >90)
Glucose, Bld: 134 mg/dL — ABNORMAL HIGH (ref 70–99)
Potassium: 3.8 mEq/L (ref 3.7–5.3)
Sodium: 144 mEq/L (ref 137–147)

## 2014-03-22 LAB — CBC
HEMATOCRIT: 32.9 % — AB (ref 36.0–46.0)
Hemoglobin: 9.9 g/dL — ABNORMAL LOW (ref 12.0–15.0)
MCH: 22 pg — AB (ref 26.0–34.0)
MCHC: 30.1 g/dL (ref 30.0–36.0)
MCV: 73.3 fL — ABNORMAL LOW (ref 78.0–100.0)
Platelets: 126 10*3/uL — ABNORMAL LOW (ref 150–400)
RBC: 4.49 MIL/uL (ref 3.87–5.11)
RDW: 21.8 % — AB (ref 11.5–15.5)
WBC: 16.8 10*3/uL — AB (ref 4.0–10.5)

## 2014-03-22 LAB — GLUCOSE, CAPILLARY
GLUCOSE-CAPILLARY: 143 mg/dL — AB (ref 70–99)
GLUCOSE-CAPILLARY: 134 mg/dL — AB (ref 70–99)
Glucose-Capillary: 117 mg/dL — ABNORMAL HIGH (ref 70–99)
Glucose-Capillary: 220 mg/dL — ABNORMAL HIGH (ref 70–99)

## 2014-03-22 MED ORDER — SENNA 8.6 MG PO TABS
1.0000 | ORAL_TABLET | Freq: Every day | ORAL | Status: DC
  Administered 2014-03-22 – 2014-03-26 (×4): 8.6 mg via ORAL
  Filled 2014-03-22 (×5): qty 1

## 2014-03-22 MED ORDER — NITROGLYCERIN 0.4 MG SL SUBL
SUBLINGUAL_TABLET | SUBLINGUAL | Status: AC
  Filled 2014-03-22: qty 1

## 2014-03-22 MED ORDER — DILTIAZEM HCL 100 MG IV SOLR
5.0000 mg/h | INTRAVENOUS | Status: DC
  Administered 2014-03-23: 10 mg/h via INTRAVENOUS
  Administered 2014-03-23: 5 mg/h via INTRAVENOUS
  Administered 2014-03-24 – 2014-03-26 (×5): 10 mg/h via INTRAVENOUS
  Filled 2014-03-22 (×4): qty 100

## 2014-03-22 MED ORDER — AMIODARONE HCL 200 MG PO TABS
200.0000 mg | ORAL_TABLET | Freq: Every day | ORAL | Status: DC
  Administered 2014-03-22 – 2014-03-23 (×2): 200 mg via ORAL
  Filled 2014-03-22 (×2): qty 1

## 2014-03-22 MED ORDER — METOPROLOL TARTRATE 1 MG/ML IV SOLN
INTRAVENOUS | Status: AC
  Filled 2014-03-22: qty 5

## 2014-03-22 MED ORDER — METOPROLOL TARTRATE 1 MG/ML IV SOLN
5.0000 mg | Freq: Once | INTRAVENOUS | Status: AC
  Administered 2014-03-22: 5 mg via INTRAVENOUS
  Filled 2014-03-22: qty 5

## 2014-03-22 NOTE — Progress Notes (Signed)
Pt resting in bed  02 sat 88% on 6L. Lungs diminished in the bases. Pt denies feeling SOB. MD on call made aware. New order received to hold  pm xanax and to give IV lopressor. Will cont to monitor pt.

## 2014-03-22 NOTE — Progress Notes (Signed)
I have seen and examined this patient. I have discussed with Dr Berkley Harvey.  I agree with their findings and plans as documented in their progress note.

## 2014-03-22 NOTE — Progress Notes (Signed)
PT Cancellation Note  Patient Details Name: Syriah Delisi MRN: [id number] DOB: Sep 11, 1932   Cancelled Treatment:    Reason Eval/Treat Not Completed: Other (comment).  Pt just got done working with OT and SLP.  Rehab MD just in room as well.  PT to let pt rest and will try to check back later as time allows.    Thanks,    Barbarann Ehlers. Palin Tristan, PT, DPT [phone]   03/22/2014, 2:17 PM

## 2014-03-22 NOTE — Progress Notes (Signed)
Physical Therapy Treatment Patient Details Name: Stacy Pittman MRN: [id number] DOB: [birth year] Today's Date: 03/22/2014    History of Present Illness 80 y.o. female admitted to [facility] on 04/03/14 due to LOC at home with some TIA symptoms (L sided weakness) and jerking motions (as seen by family).  A family member was able to keep her from falling to the floor.  CTA revealed " effusions and atelectasis vs PNA"  There is no evidence of PE.  Pt with significant PMHx of A-fib, COPD (uses O2 at night and PRN based on systems at home 2 L O2 [place]), skin CA (s/p excision on her face), CHF, and HTN.  MRI revealed extensive infarction throughout the cerebellum, mostly in the region of the superior cerebellum but with some involvement inferiorly on the right.    PT Comments    Pt is progressing slowly with mobility.  She continues to need cues for safety and support at trunk for balance due to ataxia throughout.  She needs cues to remember compensatory strategies (using target) throughout session, but is very hard working and willing to participate every time despite fatigue.  She was limited this session by 3/4 DOE, however, she was stable at 96% O2 on 4 L O2 [place] throughout session.  HR increased to 122.  PT continues to endorse inpatient rehab after discharge from acute for much needed intensive therapy.    Follow Up Recommendations  CIR     Equipment Recommendations  None recommended by PT    Recommendations for Other Services   NA     Precautions / Restrictions Precautions Precautions: Fall Precaution Comments: full body ataxia Restrictions Weight Bearing Restrictions: No    Mobility  Bed Mobility Overal bed mobility: Needs Assistance Bed Mobility: Rolling;Supine to Sit Rolling: Supervision   Supine to sit: Mod assist (min A to come up to sit with Mod A to scoot forward-all while focusing on a target) Sit to supine: Min assist   General bed mobility comments: Min assist of right leg to get all  the way into the bed. Mod assist to scoot up to [facility] with bed in trendelenberg.   Transfers Overall transfer level: Needs assistance Equipment used: Rolling walker (2 wheeled) Transfers: Sit to/from [facility] Sit to Stand: Min assist Stand pivot transfers: Mod assist       General transfer comment: Min assist to support trunk to get to standing.  Verbal cues for safe hand placement during transitions.  Stood from recliner, BSC, and bed.  Mod assist to support trunk and help steer RW to pivot.  Pt often times reaching hand from RW to bed or chair armrests prematurely "diving" to her next sitting surface.  Cues to find target on RW to use during transitions.     Modified Rankin (Stroke Patients Only) Modified Rankin (Stroke Patients Only) Pre-Morbid Rankin Score: Moderate disability Modified Rankin: Severe disability     Balance Overall balance assessment: Needs assistance Sitting-balance support: Feet supported Sitting balance-Leahy Scale: Fair     Standing balance support: Bilateral upper extremity supported Standing balance-Leahy Scale: Poor Standing balance comment: Pt needed total assist for peri care after toileting on BSC she was able to stabilize her self statically with RW and min assist at her trunk while therapist cleaned her.                      Cognition Arousal/Alertness: Awake/alert Behavior During Therapy: WFL for tasks assessed/performed Overall Cognitive Status: No family/caregiver present  to determine baseline cognitive functioning                             Pertinent Vitals/Pain HR up to 122 bpm max observed during session.  DOE 3/4 with just 3 transfers and O2 sats 96% on 4 L O2 [place] despite DOE levels.            PT Goals (current goals can now be found in the care plan section) Acute Rehab PT Goals Patient Stated Goal: Pt wants to do what she has to do to get better.  Progress towards PT goals: Progressing toward  goals    Frequency  Min 4X/week    PT Plan Current plan remains appropriate       End of Session Equipment Utilized During Treatment: Gait belt Activity Tolerance: Patient limited by fatigue;Other (comment) (DOE) Patient left: in bed;with call bell/phone within reach;with bed alarm set     Time: [phone] PT Time Calculation (min): 13 min  Charges:  $Therapeutic Activity: 8-22 mins                     Choice Kleinsasser B. Rankin, [place], DPT [phone]   03/22/2014, 3:38 PM

## 2014-03-22 NOTE — Consult Note (Signed)
Physical Medicine and Rehabilitation Consult  Reason for Consult: Ataxia and dizziness Referring Physician:  Dr. Berkley Harvey   HPI: Stacy Pittman is a 80 y.o. female with a history of melanoma, COPD-O2 at nights, CHF, A Fib on chronic coumadin (INR- 1.36 at admission) who was admitted on 04/08/2014 with dizziness, question LOC with jerking movements, confusion and left hemiparesis. MRI/MRA brain with extensive infarction throughout the cerebellum mostly in the region of the superior cerebellum but with some involvement inferiorly on the right as well as swelling with mass effect upon the fourth ventricle. Chronic R-ICA occlusion. Patient with rapid improvement of symptoms and EEG done to rule out seizure activity.  2D echo with EF 45-50% with suggestion of akinesis of apex and distal septal wall and grade 3 diastolic dysfunction.  EEG normal and no seizure activity noted. Swallow evaluation with s/s of aspiration and started on D 3, thin liquids--NO straws. CTA chest without evidence of PE. Patient with embolic stroke due to A fib and neurology recommended changing coumadin to eliquis for secondary stroke prevention.  Therapy evaluation done yesterday and patient limited by dizziness, vestibular issues as well as significant ataxia. MD and therapy team recommending CIR.    Review of Systems  HENT: Negative for hearing loss.   Eyes: Negative for blurred vision and double vision.  Respiratory: Negative for cough and shortness of breath.   Cardiovascular: Negative for chest pain and palpitations.  Gastrointestinal: Positive for heartburn and abdominal pain. Negative for constipation.  Genitourinary: Negative for urgency.  Musculoskeletal: Negative for myalgias.  Neurological: Positive for dizziness and focal weakness. Negative for headaches.     Past Medical History  Diagnosis Date  . Atrial fibrillation   . COPD (chronic obstructive pulmonary disease)   . CHF (congestive heart failure)   .  Cancer     skin  . Hypertension   . Shortness of breath     Past Surgical History  Procedure Laterality Date  . Skin cancer excision    . Tubal ligation    . Cataract extraction      History reviewed. No pertinent family history.  Social History:  Lives with family. 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  Independent with walker PTA. She reports that she quit smoking about 12 years ago. She has never used smokeless tobacco. She reports that she does not drink alcohol or use illicit drugs.  Allergies: No Known Allergies  Medications Prior to Admission  Medication Sig Dispense Refill  . albuterol (PROVENTIL HFA;VENTOLIN HFA) 108 (90 BASE) MCG/ACT inhaler Inhale 2 puffs into the lungs every 6 (six) hours as needed for wheezing or shortness of breath.      . ALPRAZolam (XANAX) 0.5 MG tablet Take 0.5 mg by mouth 3 (three) times daily.      Marland Kitchen amiodarone (PACERONE) 200 MG tablet Take 200 mg by mouth daily.      Marland Kitchen aspirin 81 MG tablet Take 81 mg by mouth daily.      Marland Kitchen atorvastatin (LIPITOR) 10 MG tablet Take 10 mg by mouth daily.      . furosemide (LASIX) 20 MG tablet Take 10 mg by mouth daily.       Marland Kitchen gabapentin (NEURONTIN) 300 MG capsule Take 300 mg by mouth 3 (three) times daily.      Marland Kitchen lisinopril (PRINIVIL,ZESTRIL) 10 MG tablet Take 10 mg by mouth daily.      . mirtazapine (REMERON) 15 MG tablet Take 15 mg by mouth at bedtime.      Marland Kitchen  OXYGEN-HELIUM IN Inhale 2 L into the lungs at bedtime.      . potassium chloride (K-DUR) 10 MEQ tablet Take 10 mEq by mouth 2 (two) times daily.       Marland Kitchen tiotropium (SPIRIVA) 18 MCG inhalation capsule Place 18 mcg into inhaler and inhale daily.       Marland Kitchen warfarin (COUMADIN) 1 MG tablet Take 1-1.5 mg by mouth daily. 1mg  daily, except on Mon/Thus 1.5mg         Home: Home Living Family/patient expects to be discharged to:: Inpatient rehab Living Arrangements: Children Available Help at Discharge: Family Type of Home: House Home Access: Stairs to enter [employer] of Steps: 3-4 Entrance Stairs-Rails: None Home Layout: One level Home Equipment: Environmental consultant - 2 wheels;Bedside commode;Tub bench  Functional History: Prior Function Level of Independence: Needs assistance Gait / Transfers Assistance Needed: mod I with RW ADL's / Homemaking Assistance Needed: Pt mod I with ADL. Family S for bathing/tub transfers Communication / Swallowing Assistance Needed: none Comments: now has swallow precautions and altered diet (per speech therapy because she does not have her bottom set of dentures).   Functional Status:  Mobility: Bed Mobility Overal bed mobility: Needs Assistance Bed Mobility: Supine to Sit;Sit to Supine Sidelying to sit: Mod assist Supine to sit: Mod assist;HOB elevated Sit to supine: Min assist General bed mobility comments: Mod assist to support trunk and help legs to EOB during initiation of going to EOB.  Min assist of right leg to get back in bed.  Pt using gravity to pull trunk into bed.   Transfers Overall transfer level: Needs assistance Equipment used: Rolling walker (2 wheeled) Transfers: Sit to/from Stand Sit to Stand: Min assist Stand pivot transfers: Max assist General transfer comment: Min assist to get to standing from mildly elevated bed.  Pt relying heavily on upper extremity support and legs pressed against the lower railing of the bed for stability during transitions sit <-> stand.  Pt helping pt with target focusing to control her dizziness and trunk ataxia (the more she focuses on a target, the less sway she has at her trunk) Ambulation/Gait General Gait Details: two person assist needed to attempt safely.      ADL: ADL Overall ADL's : Needs assistance/impaired Eating/Feeding: Minimal assistance;Sitting Eating/Feeding Details (indicate cue type and reason): Pt coughing after drinking small sip of water. speech notified. Pt has difficulty controlling motor pattern to feed self Grooming: Moderate  assistance;Sitting Grooming Details (indicate cue type and reason): limited by ataxia Upper Body Bathing: Moderate assistance;Sitting Lower Body Bathing: Maximal assistance;Sit to/from stand Upper Body Dressing : Moderate assistance;Sitting Lower Body Dressing: Maximal assistance;Sit to/from stand Toilet Transfer: Maximal assistance;Stand-pivot Toileting- Clothing Manipulation and Hygiene: Maximal assistance;Sit to/from stand Functional mobility during ADLs: Maximal assistance (for trasnfers. Will need +2 for ambulation) General ADL Comments: significantly impacted by ataxia  Cognition: Cognition Overall Cognitive Status: No family/caregiver present to determine baseline cognitive functioning Orientation Level: Oriented to person;Oriented to place;Oriented to time Cognition Arousal/Alertness: Awake/alert Behavior During Therapy: WFL for tasks assessed/performed Overall Cognitive Status: No family/caregiver present to determine baseline cognitive functioning  Blood pressure 120/64, pulse 93, temperature 100.8 F (38.2 C), temperature source Oral, resp. rate 16, weight 64.365 kg (141 lb 14.4 oz), SpO2 92.00%. Physical Exam  Nursing note and vitals reviewed. Constitutional: She is oriented to person, place, and time. She appears well-developed and well-nourished. Nasal cannula in place.  RUE/RLE dyskinesias at rest.   HENT:  Head: Normocephalic and atraumatic.  Eyes: Conjunctivae  are normal. Pupils are equal, round, and reactive to light.  Neck: Normal range of motion. Neck supple.  Cardiovascular: Normal rate.   Respiratory: Effort normal and breath sounds normal. No respiratory distress. She has no wheezes.  GI: Soft. Bowel sounds are normal. She exhibits no distension. There is no tenderness.  Musculoskeletal: She exhibits no edema and no tenderness.  Neurological: She is alert and oriented to person, place, and time.  Speech soft but clear. Follows basic commands without  difficulty. BUE with ataxic movements. speech dysarthric but intelligbile. Strength 4/5 at least. Ataxia RUE>RLE and RLE>LLE. No sensory findings. Had substantial difficulties drinking out of a spill proof cup. Had some residue which she cleared after swallowing the thin liquid as well.  Skin: Skin is warm and dry.  Psychiatric: She has a normal mood and affect. Her behavior is normal.    Results for orders placed during the hospital encounter of 04/08/2014 (from the past 24 hour(s))  URINE RAPID DRUG SCREEN (HOSP PERFORMED)     Status: Abnormal   Collection Time    03/21/14  9:52 AM      Result Value Ref Range   Opiates NONE DETECTED  NONE DETECTED   Cocaine NONE DETECTED  NONE DETECTED   Benzodiazepines POSITIVE (*) NONE DETECTED   Amphetamines NONE DETECTED  NONE DETECTED   Tetrahydrocannabinol NONE DETECTED  NONE DETECTED   Barbiturates NONE DETECTED  NONE DETECTED  URINALYSIS, ROUTINE W REFLEX MICROSCOPIC     Status: Abnormal   Collection Time    03/21/14  9:52 AM      Result Value Ref Range   Color, Urine YELLOW  YELLOW   APPearance HAZY (*) CLEAR   Specific Gravity, Urine 1.027  1.005 - 1.030   pH 5.5  5.0 - 8.0   Glucose, UA NEGATIVE  NEGATIVE mg/dL   Hgb urine dipstick NEGATIVE  NEGATIVE   Bilirubin Urine NEGATIVE  NEGATIVE   Ketones, ur NEGATIVE  NEGATIVE mg/dL   Protein, ur NEGATIVE  NEGATIVE mg/dL   Urobilinogen, UA 0.2  0.0 - 1.0 mg/dL   Nitrite POSITIVE (*) NEGATIVE   Leukocytes, UA LARGE (*) NEGATIVE  URINE MICROSCOPIC-ADD ON     Status: Abnormal   Collection Time    03/21/14  9:52 AM      Result Value Ref Range   Squamous Epithelial / LPF FEW (*) RARE   WBC, UA TOO NUMEROUS TO COUNT  <3 WBC/hpf   Bacteria, UA MANY (*) RARE  GLUCOSE, CAPILLARY     Status: Abnormal   Collection Time    03/21/14 12:09 PM      Result Value Ref Range   Glucose-Capillary 131 (*) 70 - 99 mg/dL   Comment 1 Documented in Chart     Comment 2 Notify RN    GLUCOSE, CAPILLARY      Status: Abnormal   Collection Time    03/21/14  4:44 PM      Result Value Ref Range   Glucose-Capillary 124 (*) 70 - 99 mg/dL  CBC     Status: Abnormal   Collection Time    03/22/14  5:57 AM      Result Value Ref Range   WBC 16.8 (*) 4.0 - 10.5 K/uL   RBC 4.49  3.87 - 5.11 MIL/uL   Hemoglobin 9.9 (*) 12.0 - 15.0 g/dL   HCT 32.9 (*) 36.0 - 46.0 %   MCV 73.3 (*) 78.0 - 100.0 fL   MCH 22.0 (*) 26.0 -  34.0 pg   MCHC 30.1  30.0 - 36.0 g/dL   RDW 21.8 (*) 11.5 - 15.5 %   Platelets 126 (*) 150 - 400 K/uL  BASIC METABOLIC PANEL     Status: Abnormal   Collection Time    03/22/14  5:57 AM      Result Value Ref Range   Sodium 144  137 - 147 mEq/L   Potassium 3.8  3.7 - 5.3 mEq/L   Chloride 108  96 - 112 mEq/L   CO2 19  19 - 32 mEq/L   Glucose, Bld 134 (*) 70 - 99 mg/dL   BUN 18  6 - 23 mg/dL   Creatinine, Ser 0.89  0.50 - 1.10 mg/dL   Calcium 8.7  8.4 - 10.5 mg/dL   GFR calc non Af Amer 60 (*) >90 mL/min   GFR calc Af Amer 69 (*) >90 mL/min  GLUCOSE, CAPILLARY     Status: Abnormal   Collection Time    03/22/14  7:22 AM      Result Value Ref Range   Glucose-Capillary 117 (*) 70 - 99 mg/dL   Mr Jodene Nam Head/brain Wo Cm  03/20/2014   CLINICAL DATA:  Loss of consciousness. Jerking movements. . Stroke versus seizure.  EXAM: MRI HEAD WITHOUT CONTRAST  MRA HEAD WITHOUT CONTRAST  TECHNIQUE: Multiplanar, multiecho pulse sequences of the brain and surrounding structures were obtained without intravenous contrast. Angiographic images of the head were obtained using MRA technique without contrast.  COMPARISON:  Head CT 04/04/2014.  MRI 05/13/2011.  FINDINGS: MRI HEAD FINDINGS  There are large regions of acute infarction within the superior cerebellum on both sides. Some involvement of the inferior cerebellum is present on the right. The region of infarction shows swelling with mass effect upon the fourth ventricle. Ventricular size is unchanged at this moment, but with further swelling there could be  obstructive hydrocephalus. No acute infarction in the supratentorial brain. There are extensive chronic small vessel changes throughout the white matter. No large vessel territory supra tentorial infarction. No mass lesion. No extra-axial collection. No pituitary mass. No inflammatory sinus disease. No skull or skullbase lesion.  MRA HEAD FINDINGS  The right internal carotid artery is occluded to the level of the skullbase. Left internal carotid artery shows atherosclerotic irregularity in the carotid siphon region but no stenosis. The left anterior and middle cerebral vessels show mild atherosclerotic irregularity but are widely patent. Patent anterior communicating artery give supply to the anterior circulation on the right, and there is fairly normal visualization of the right anterior and middle cerebral artery. Also contributing is a patent posterior communicating artery on this side.  Both vertebral arteries are patent with the left being diminutive in the right being dominant. No basilar stenosis. Left PICA is patent. Right PICA not demonstrated. Anterior inferior cerebellar arteries are present bilaterally. There is some flow in the superior cerebellar arteries bilaterally. Both posterior cerebral arteries show flow and good distal opacification.  IMPRESSION: Extensive infarction throughout the cerebellum, mostly in the region of the superior cerebellum but with some involvement inferiorly on the right. Swelling with mass effect upon the fourth ventricle but no ventricular obstruction at this moment. No sign of hemorrhage.  Extensive chronic small vessel disease throughout the cerebral hemispheric white matter.  Chronic right ICA occlusion. Left ICA sufficiently patent with atherosclerotic irregularity in the siphon region. Good flow demonstrated in both the anterior and middle cerebral artery territories because of patent anterior and posterior communicating arteries.  Both  vertebral arteries are patent.  All major posterior circulation branches are demonstrated presently with the exception of right PICA. This includes flow within both superior cerebellar arteries at this time.   Electronically Signed   By: Nelson Chimes M.D.   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 Does the need for close, 24 hr/day medical supervision in concert with the patient's rehab needs make it unreasonable for this patient to be served in a less intensive setting? Yes 2. Co-Morbidities requiring supervision/potential complications: copd, chf 3. Due to bladder management, bowel management, safety, skin/wound care, disease management, medication administration, pain management and patient education, does the patient require 24 hr/day rehab nursing? Yes 4. Does the patient require coordinated care of a physician, rehab nurse, PT (1-2 hrs/day, 5 days/week), OT (1-2 hrs/day, 5 days/week) and SLP (1-2 hrs/day, 5 days/week) to address physical and functional deficits in the context of the above medical diagnosis(es)? Yes Addressing deficits in the following areas: balance, endurance, locomotion, strength, transferring, bowel/bladder control, bathing, dressing, feeding, grooming, toileting, speech, swallowing and psychosocial support 5. Can the patient actively participate in an intensive therapy program of at least 3 hrs of therapy per day at least 5 days per week? Yes 6. The potential for patient to make measurable gains while on inpatient rehab is excellent 7. Anticipated functional outcomes upon discharge from inpatient rehab are supervision and min assist  with PT, supervision and min assist with OT, supervision with SLP. 8. Estimated rehab length of stay to reach the above functional goals is: 18-22 days 9. Does the patient have adequate social supports to accommodate these discharge functional goals? Yes and  Potentially 10. Anticipated D/C setting: Home 11. Anticipated post D/C treatments: HH therapy and Outpatient therapy 12. Overall Rehab/Functional Prognosis: excellent  RECOMMENDATIONS: This patient's condition is appropriate for continued rehabilitative care in the following setting: CIR Patient has agreed to participate in recommended program. Yes Note that insurance prior authorization may be required for reimbursement for recommended care.  Comment: Rehab Admissions Coordinator to follow up.  Thanks,  Meredith Staggers, MD, Mellody Drown     03/22/2014

## 2014-03-22 NOTE — Progress Notes (Signed)
Stroke Team Progress Note  HISTORY Stacy Pittman is a 80 y.o. 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 Per the referring physician, the patient was in the kitchen and became lightheaded. The patient states that she remembers becoming lightheaded, but then feels that she passed out for a period of time. She does remember EMS arriving at her house, and feels that she was confused. She continues to be very soft spoken and sleepy.  She initially was reported to have left hemiparesis, but this has rapidly improved.She has a mildly disconjugate gaze which the granddaughter reports is new. She presented to eh ED 03/31/2014 at 1636. She was LKW was unclear. Patient was not administered TPA secondary to seizure onset,stroke was not initially suspected, rapidly improving symptoms. She was admitted for further evaluation and treatment.  SUBJECTIVE No family is at the bedside.  Overall she feels her condition is stable.   OBJECTIVE Most recent Vital Signs: Filed Vitals:   03/22/14 0504 03/22/14 0509 03/22/14 0800 03/22/14 0902  BP:   122/58   Pulse:   85 86  Temp: 100.8 F (38.2 C)  99.7 F (37.6 C)   TempSrc: Oral  Oral   Resp:   16 16  Weight:      SpO2: 90% 92% 93% 92%   CBG (last 3)   Recent Labs  03/21/14 1209 03/21/14 1644 03/22/14 0722  GLUCAP 131* 124* 117*    IV Fluid Intake:   . sodium chloride 75 mL/hr at 03/20/14 1535    MEDICATIONS  .  stroke: mapping our early stages of recovery book   Does not apply Once  . ALPRAZolam  0.5 mg Oral QHS  . apixaban  5 mg Oral BID  . aspirin  325 mg Oral Daily  . atorvastatin  40 mg Oral Daily  . levofloxacin  750 mg Oral Q48H  . mometasone-formoterol  2 puff Inhalation BID  . senna  1 tablet Oral Daily  . sodium chloride  500 mL Intravenous Once  . tiotropium  18 mcg Inhalation Daily   PRN:  albuterol  Diet:  Dysphagia 3 thin liquids Activity:    Bathroom privileges with assistance DVT Prophylaxis:  warfarin  CLINICALLY SIGNIFICANT STUDIES Basic Metabolic Panel:   Recent Labs Lab 03/21/14 0700 03/22/14 0557  NA 143 144  K 4.4 3.8  CL 107 108  CO2 20 19  GLUCOSE 139* 134*  BUN 20 18  CREATININE 0.85 0.89  CALCIUM 8.6 8.7   Liver Function Tests:   Recent Labs Lab 04/03/2014 1651  AST 40*  ALT 24  ALKPHOS 80  BILITOT 0.5  PROT 6.9  ALBUMIN 3.2*   CBC:   Recent Labs Lab 03/21/14 0700 03/22/14 0557  WBC 14.4* 16.8*  HGB 10.0* 9.9*  HCT 33.0* 32.9*  MCV 73.0* 73.3*  PLT 135* 126*   Coagulation:   Recent Labs Lab 04/03/2014 1651 03/20/14 0422 03/21/14 0700  LABPROT 16.4* 17.2* 16.9*  INR 1.36 1.44 1.41   Cardiac Enzymes:   Recent Labs Lab 03/20/2014 2048 03/20/14 0422 03/20/14 0800  TROPONINI <0.30 <0.30 <0.30   Urinalysis:   Recent Labs Lab 03/21/14 0952  COLORURINE YELLOW  LABSPEC 1.027  PHURINE 5.5  GLUCOSEU NEGATIVE  HGBUR NEGATIVE  BILIRUBINUR NEGATIVE  KETONESUR NEGATIVE  PROTEINUR NEGATIVE  UROBILINOGEN 0.2  NITRITE POSITIVE*  LEUKOCYTESUR LARGE*   Lipid Panel    Component Value Date/Time   CHOL  94 03/20/2014 0422   TRIG 66 03/20/2014 0422   HDL 34* 03/20/2014 0422   CHOLHDL 2.8 03/20/2014 0422   VLDL 13 03/20/2014 0422   LDLCALC 47 03/20/2014 0422   HgbA1C  Lab Results  Component Value Date   HGBA1C 6.2* 04/11/2014    Urine Drug Screen:      Component Value Date/Time   LABOPIA NONE DETECTED 03/21/2014 0952    Alcohol Level:   Recent Labs Lab 03/25/2014 2048  ETH <11    CT of the brain  03/28/2014    1. Small vessel ischemic disease and brain atrophy. 2. No acute intracranial abnormalities     MRI of the brain  03/20/2014   Extensive infarction throughout the cerebellum, mostly in the region of the superior cerebellum but with some involvement inferiorly on the right. Swelling with mass effect upon the fourth ventricle but no ventricular obstruction at this moment. No sign of  hemorrhage.  Extensive chronic small vessel disease throughout the cerebral hemispheric white matter  MRA of the brain  03/20/2014   Chronic right ICA occlusion. Left ICA sufficiently patent with atherosclerotic irregularity in the siphon region. Good flow demonstrated in both the anterior and middle cerebral artery territories because of patent anterior and posterior communicating arteries.  Both vertebral arteries are patent. All major posterior circulation branches are demonstrated presently with the exception of right PICA. This includes flow within both superior cerebellar arteries at this time.     2D Echocardiogram  Endocardial segments are poorly visualized  but there is a suggestion of akinesis of the apex and distal septal wall. The Study is inadequate to rule out apical thrombus by this study. EF 45-50%. The right ventricular systolic pressure was increased consistent with moderate pulmonary hypertension.  Carotid Doppler    CXR  03/25/2014   1. Cardiac enlargement and chronic interstitial coarsening. 2. Atherosclerosis.     CT Angio Chest  03/20/2014   1. No evidence of pulmonary embolus. 2. Trace bilateral pleural effusions noted. Bibasilar airspace opacification may reflect atelectasis or pneumonia. Interstitial prominence also seen; this could reflect minimal interstitial edema. 3. Underlying emphysema noted bilaterally. 4. Mild biatrial enlargement seen. 5. Diffuse coronary artery calcifications seen.     EEG  This is a normal EEG. No epileptiform activity is noted.   EKG  Ectopic atrial tachycardia. For complete results please see formal report.   Therapy Recommendations SNF  Physical Exam   Pleasant caucasian lady not in distress.Awake alert. Afebrile. Head is nontraumatic. Neck is supple without bruit. Hearing is normal. Cardiac exam no murmur or gallop. Lungs are clear to auscultation. Distal pulses are well felt. Neurological Exam ;  Awake  Alert oriented x 3. Normal speech and  language.eye movements full without nystagmus.fundi were not visualized. Vision acuity and fields appear normal. Hearing is normal. Palatal movements are normal. Face symmetric. Tongue midline. Normal strength, tone, reflexes but impaired coordination right more than left.Truncal ataxia present.Normal sensation. Gait deferred. ASSESSMENT Ms. Laurianne Floresca is a 80 y.o. female presenting with syncope, loss of consciousness as well as left hemiparesis and dysconjugate gaze. 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 Infarcts felt to be embolic secondary to known atrial fibrillation.  On warfarin prior to admission with INR subtherapeutic at 1.36 on arrival. Now on Eliquis for secondary stroke prevention.   atrial fibrillation, on coumadin prior to admission hypertension Hyperlipidemia, LDL 47, on lipitor 10 mg PTA, now on lipitor 40 mg, at goal  LDL < 100 CHF COPD, home O2  Hospital day # 3  TREATMENT/PLAN  Continue eliquis for stroke prevention but discontinue aspirin  Recommend carotid doppler to complete vascular workup for potential stroke source  Await rehab eval and disposition   Antony Contras, MD 03/22/2014 10:14 AM  To contact Stroke Continuity provider, please refer to [email or web address] After hours, contact General Neurology

## 2014-03-22 NOTE — Progress Notes (Signed)
Patient's BP = 100/46 in right arm and 105/37 in left arm.  I notified Dr. Skeet Simmer she stated to hold the one time dose of IV metoprolol and reassess need  at 8pm.  I will pass this on to night shift RN.

## 2014-03-22 NOTE — Progress Notes (Signed)
I met with pt at bedside and then contacted her son, Broadus John by phone. We discussed inpt rehab venue as an option for her recovery. She is in agreement and pt's son prefers inpt rehab rather than SNF if insurance will approve. I have contacted onsite representative for [employer] to begin insurance approval for possible admission to inpt rehab Monday. I will follow up Monday morning. [phone]

## 2014-03-22 NOTE — Progress Notes (Signed)
RN notified of rhythm change to AFIB by CMT.  EKG obtained= Atrial Fib with RVR.  Dr. Skeet Simmer notified.  She resumed po amiodarone.

## 2014-03-22 NOTE — Progress Notes (Signed)
Occupational Therapy Treatment Patient Details Name: Stacy Pittman MRN: [id number] DOB: 12-09-1932 Today's Date: 03/22/2014    History of present illness 80 y.o. female admitted to [facility] on 04-13-2014 due to LOC at home with some TIA symptoms (L sided weakness) and jerking motions (as seen by family).  A family member was able to keep her from falling to the floor.  CTA revealed " effusions and atelectasis vs PNA"  There is no evidence of PE.  Pt with significant PMHx of A-fib, COPD (uses O2 at night and PRN based on systems at home 2 L O2 [place]), skin CA (s/p excision on her face), CHF, and HTN.  MRI revealed extensive infarction throughout the cerebellum, mostly in the region of the superior cerebellum but with some involvement inferiorly on the right.   OT comments  This 80 yo making progress towards toilet transfer goal of Min A and additional goal of working on gaze stabilization exercises. Goal of self feeding added.  Follow Up Recommendations  CIR;Supervision/Assistance - 24 hour    Equipment Recommendations   (TBD at next venue)    Recommendations for Other Services Rehab consult    Precautions / Restrictions Precautions Precautions: Fall Precaution Comments: full body ataxia Restrictions Weight Bearing Restrictions: No       Mobility Bed Mobility Overal bed mobility: Needs Assistance Bed Mobility: Rolling;Supine to Sit Rolling: Supervision   Supine to sit: Mod assist (min A to come up to sit with Mod A to scoot forward-all while focusing on a target)        Transfers Overall transfer level: Needs assistance Equipment used: 1 person hand held assist Transfers: Sit to/from [facility] Sit to Stand: Min assist Stand pivot transfers: Mod assist       General transfer comment: The whole time focusing on a target    Balance Overall balance assessment: Needs assistance Sitting-balance support: Feet supported;Bilateral upper extremity supported Sitting  balance-Leahy Scale: Fair     Standing balance support: Bilateral upper extremity supported Standing balance-Leahy Scale: Poor                     ADL                           Toilet Transfer: Moderate assistance (Bed>recliner next to bed (focusing on a target to help with ataxia))             General ADL Comments: Upon entering room pt trying to feed herself, but having great difficulty due to ataxia and positioning in bed. Repostioned pt and placed food tray down in her lap. Propped up RUE on a couple of blankets to give her more proximal support for hand to mouth. Pt has to use alot of energy to self feed. She was able to eat 1/2 of her piece of strawberry shortcake with increased time and effort to get each piece on spoon or fork with cues to keep her head against the blanket behind her head (since her head is ataxic too when not supported. Had pt try to drink from the regular cafeteria cup that comes on her tray and this was difficult due to the size, weight, and her ataxia. Acquired a cup with two handles and spouted lid to see if this would help (it did from a self drinking stand point, but not sure it will work from a swallowing standpoint--left SLP in working with pt to see what she thought  and to let nurse know if she can continue to use it or needs to wait until after swallow test).       Vision                 Additional Comments: Had pt try and keep looking at a target and turn her head left or right --she was unable to do this. So, then had her follow a target to the left/right with eyes only and then turn her head that side--better, but still has difficulty turning head.          Cognition   Behavior During Therapy: WFL for tasks assessed/performed Overall Cognitive Status: No family/caregiver present to determine baseline cognitive functioning                                             Frequency Min 3X/week     Progress  Toward Goals  OT Goals(current goals can now be found in the care plan section)  Progress towards OT goals: Progressing toward goals     Plan Discharge plan remains appropriate       End of Session Equipment Utilized During Treatment: Gait belt   Activity Tolerance Patient tolerated treatment well   Patient Left in chair;with call bell/phone within reach (with SLP working with her)   Nurse Communication  SLP to let her know if ok for pt to continue to use two handled cup with spout        Time: 1316-1350 OT Time Calculation (min): 34 min  Charges: OT General Charges $OT Visit: 1 Procedure OT Treatments $Self Care/Home Management : 8-22 mins $Therapeutic Activity: 8-22 mins  Almon Register [phone] 03/22/2014, 2:58 PM

## 2014-03-22 NOTE — Progress Notes (Addendum)
Interim Progress Note:   S: Went to evaluate patient for afib. She denies any complaints; specifically no CP, SOB (though O2 requirement increased).    O:  BP 116/73  Pulse 119  Temp(Src) 99.4 F (37.4 C) (Oral)  Resp 16  Wt 141 lb 14.4 oz (64.365 kg)  SpO2 93% General: alert, cooperative and no distress Heart: irreg, irreg rhythm with RVR 110-120s  Lungs: Rhonchi appreciated in RUL, no rales  A/P  - Will obtained CXR - Metoprolol given previous for Afib w/ RVR, will hold off on additional meds for now, but considering Dilt drip if worsens or Cardiology consult for CP or hypotension  Phill Myron,  [facility] PGY-1 Resident   Agree with Dr Milagros Reap management. Has fine crackles superior portion of right lower lobe. Is comfortable-appearing but O2 sat is dropping into upper80s on 5L O2. HR now in 130s. Starting diltiazem drip titrated to SBP>90. Will await CXR results.  Hilton Sinclair, MD PGY-2, [place]

## 2014-03-22 NOTE — Progress Notes (Signed)
Family Medicine Teaching Service Daily Progress Note Intern Pager: [phone]  Patient name: Stacy Pittman Medical record number: [id number] Date of birth: [birth year] Age: 80 y.o. Gender: female  Primary Care Provider: Margarita Rana, MD Consultants: None Code Status: DNR  Pt Overview and Major Events to Date:  4/7: LOC w/ seizure-like activity and left side weakness 4/8: Extensive infarction throughout the cerebellum 4/9: CIR recommended; awaiting evaluation  Assessment and Plan: Stacy Pittman is a 80 y.o. female presenting with loss of consciousness and TIA symptoms . PMH is significant for HTN, COPD, HLD, afib, and CHF.   # CVA w/ seizure-like activity - EEG: normal, no epileptiform  - MRI/MRA   Extensive infarction throughout the cerebellum, mostly in the region of the superior cerebellum but with some involvement inferiorly on the right.   Swelling with mass effect upon the fourth ventricle but no ventricular obstruction at this moment. No sign of hemorrhage.   Extensive chronic small vessel disease throughout the cerebral hemispheric white matter.   Chronic right ICA occlusion. Left ICA sufficiently patent. Both vertebral arteries are patent.  -neuro consulted f/u their recs   Switched to Eliquis 4/9 - ECHO  akinesis of the apex and distal septal wall. The Study is inadequate to rule out apical thrombus by this study. Recommend Cardiac MRI to further delineate for LV thrombus  EF 54-27%, Diastolic dysfcn grade 3  Pulmonary HTN: PA pressure = 57 -PT recs: SNF; OT recs CIR  Awaiting CIR evaluation -SLP: Dysphagia 3 (mechanical soft, thin liquid) - Lipitor inc to 40 mg qhs - On Xanax 0.5mg  qhs; Will discuss tapering off   # Leukocytosis: No signs of infection on exam. CXR possible PNA. Abd soft and non-tender. Has been afebrile. If the patient had a seizure this may be a reactive leukocytosis.  - WBC to 16.8 (4/10) > 14.4 (4/9) > 12.9 (4/8) - UA: Many bacteria, Nit and  Leu +; Urine culture: not obtained? UCx today - Levaquin (4/9 >>  # Anemia: Hgb 11.3 on admission. No prior values noted. This is microcytic. No mention of bleeding on interview.  -Hgb 9.9 (4/10) < 10 (4/9) 11.3 on 4/7 -monitor for bleeding  - FOBT: to be obtained - Will start PO iron on discharge  # AKI on CKD: Resolved - Cr 0.89 (4/10) < 1.16 with GFR 43. No mention of kidney disease on discussion with family.   # COPD: on home O2 2L [place]. At baseline O2 use. Patient uses Advair and albuterol at home - Dulera BID - Home Spiriva daily - albuterol q6hrs PRN for wheezing and shortness of breath -monitor respiratory status   # Afib: rate controlled in the ED.  -will continue home amiodarone  - Switched to Eliquis  FEN/GI: Dys 3 Diet; NS @ 75 Prophylaxis: On Eliquis  Disposition: pending neuro recommendations  Subjective:  No complaints today, feeling a little better. Denies dysuria or CP, SOB.  Objective: Temp:  [97.7 F (36.5 C)-100.8 F (38.2 C)] 100.8 F (38.2 C) (04/10 0504) Pulse Rate:  [74-93] 93 (04/10 0400) Resp:  [16-18] 16 (04/09 2000) BP: (112-120)/(47-64) 120/64 mmHg (04/10 0400) SpO2:  [88 %-95 %] 92 % (04/10 0509)  Physical Exam: General: Laying in bed, in no acute distress Cardiovascular: Regular rate/rhythm, no murmurs, distal pulses intact Respiratory:CTAB, no increased work of breathing Abdomen: soft, non-tender, non-distended Extremities: warm, no calf tenderness  Laboratory:  Recent Labs Lab 03/20/14 0422 03/21/14 0700 03/22/14 0557  WBC 12.9* 14.4* 16.8*  HGB 10.5* 10.0* 9.9*  HCT 34.4* 33.0* 32.9*  PLT 127* 135* 126*    Recent Labs Lab 04/05/2014 1651 03/20/14 0422 03/21/14 0700 03/22/14 0557  NA 140 139 143 144  K 4.3 5.3 4.4 3.8  CL 102 104 107 108  CO2 22 19 20 19   BUN 25* 26* 20 18  CREATININE 1.16* 1.09 0.85 0.89  CALCIUM 8.9 8.2* 8.6 8.7  PROT 6.9  --   --   --   BILITOT 0.5  --   --   --   ALKPHOS 80  --   --   --    ALT 24  --   --   --   AST 40*  --   --   --   GLUCOSE 116* 126* 139* 134*   Cardiac Panel (last 3 results)  Recent Labs  03/22/2014 2048 03/20/14 0422 03/20/14 0800  TROPONINI <0.30 <0.30 <0.30   D-Dimer: 0.70  Imaging/Diagnostic Tests:  Dg Chest 2 View  04/03/2014 IMPRESSION: 1. Cardiac enlargement and chronic interstitial coarsening. 2. Atherosclerosis.   Electronically Signed   By: Kerby Moors M.D.   On: 03/22/2014 17:28   Ct Head Wo Contrast  03/29/2014 IMPRESSION: 1. Small vessel ischemic disease and brain atrophy. 2. No acute intracranial abnormalities   Electronically Signed   By: Kerby Moors M.D.   On: 03/13/2014 17:21   Ct Angio Chest Pe W/cm &/or Wo Cm  03/20/2014 IMPRESSION: 1. No evidence of pulmonary embolus. 2. Trace bilateral pleural effusions noted. Bibasilar airspace opacification may reflect atelectasis or pneumonia. Interstitial prominence also seen; this could reflect minimal interstitial edema. 3. Underlying emphysema noted bilaterally. 4. Mild biatrial enlargement seen. 5. Diffuse coronary artery calcifications seen.   Electronically Signed   By: Garald Balding M.D.   On: 03/20/2014 03:37   MRI HEAD FINDINGS 03/20/14 There are large regions of acute infarction within the superior  cerebellum on both sides. Some involvement of the inferior  cerebellum is present on the right. The region of infarction shows  swelling with mass effect upon the fourth ventricle. Ventricular  size is unchanged at this moment, but with further swelling there  could be obstructive hydrocephalus. No acute infarction in the  supratentorial brain. There are extensive chronic small vessel  changes throughout the white matter. No large vessel territory supra  tentorial infarction. No mass lesion. No extra-axial collection. No  pituitary mass. No inflammatory sinus disease. No skull or skullbase  lesion.  MRA HEAD FINDINGS  The right internal carotid artery is occluded to the  level of the  skullbase. Left internal carotid artery shows atherosclerotic  irregularity in the carotid siphon region but no stenosis. The left  anterior and middle cerebral vessels show mild atherosclerotic  irregularity but are widely patent. Patent anterior communicating  artery give supply to the anterior circulation on the right, and  there is fairly normal visualization of the right anterior and  middle cerebral artery. Also contributing is a patent posterior  communicating artery on this side.  Both vertebral arteries are patent with the left being diminutive in  the right being dominant. No basilar stenosis. Left PICA is patent.  Right PICA not demonstrated. Anterior inferior cerebellar arteries  are present bilaterally. There is some flow in the superior  cerebellar arteries bilaterally. Both posterior cerebral arteries  show flow and good distal opacification.  IMPRESSION:  Extensive infarction throughout the cerebellum, mostly in the region  of the superior cerebellum but with some involvement inferiorly on  the right.  Swelling with mass effect upon the fourth ventricle but  no ventricular obstruction at this moment. No sign of hemorrhage.  Extensive chronic small vessel disease throughout the cerebral  hemispheric white matter.  Chronic right ICA occlusion. Left ICA sufficiently patent with  atherosclerotic irregularity in the siphon region. Good flow  demonstrated in both the anterior and middle cerebral artery  territories because of patent anterior and posterior communicating  arteries.  Both vertebral arteries are patent. All major posterior circulation  branches are demonstrated presently with the exception of right  PICA. This includes flow within both superior cerebellar arteries at  this time.   Phill Myron, MD 03/22/2014, 8:12 AM PGY-1, [place] Intern pager: [phone], text pages welcome

## 2014-03-22 NOTE — Progress Notes (Signed)
Speech Language Pathology Treatment: Dysphagia  Patient Details Name: Stacy Pittman MRN: [id number] DOB: [birth year] Today's Date: 03/22/2014 Time: 1962-2297 SLP Time Calculation (min): 12 min  Assessment / Plan / Recommendation Clinical Impression  F/u for dysphagia: Pt finishing session with OT. Continues to cough intermittently with consumption of thin liquids, concerning for aspiration. Various strategies were trialed in an effort to improve clinical performance, however, cough occurred with and without chin tuck strategy, with and without straw use, and despite bolus size.  S/s aspiration eliminated only after liquids were thickened to a nectar-consistency.  Pt agreed she was more comfortable drinking the modified liquids.  Recommend use of adaptive cup with lid provided by OT; pt may use a straw when liquids are nectar-thick.  May benefit from [facility] to determine nature of dysphagia.   HPI HPI: Stacy Pittman is a 80 y.o. female presenting with loss of consciousness and TIA symptoms . PMH is significant for HTN, COPD, HLD, afib, and CHF. RN reported pt getting choked on liquids this morning.MRI shows bilateral cerebellar CVAs.       SLP Plan  Continue with current plan of care    Recommendations Diet recommendations: Dysphagia 3 (mechanical soft);Nectar-thick liquid Liquids provided via: Cup;Straw Medication Administration: Whole meds with puree Supervision: Patient able to self feed;Full supervision/cueing for compensatory strategies Compensations: Slow rate;Small sips/bites Postural Changes and/or Swallow Maneuvers: Seated upright 90 degrees;Upright 30-60 min after meal;Chin tuck              General recommendations: Rehab consult Oral Care Recommendations: Oral care BID Follow up Recommendations: Inpatient Rehab Plan: Continue with current plan of care   Stacy Pittman L. Tivis Ringer, [place] CCC/SLP Pager [phone]      Assunta Curtis 03/22/2014, 2:27 PM

## 2014-03-23 ENCOUNTER — Encounter (HOSPITAL_COMMUNITY): Payer: Self-pay | Admitting: Internal Medicine

## 2014-03-23 DIAGNOSIS — I4891 Unspecified atrial fibrillation: Secondary | ICD-10-CM

## 2014-03-23 DIAGNOSIS — I6330 Cerebral infarction due to thrombosis of unspecified cerebral artery: Secondary | ICD-10-CM

## 2014-03-23 DIAGNOSIS — R55 Syncope and collapse: Secondary | ICD-10-CM | POA: Diagnosis not present

## 2014-03-23 DIAGNOSIS — I6340 Cerebral infarction due to embolism of unspecified cerebral artery: Secondary | ICD-10-CM | POA: Diagnosis not present

## 2014-03-23 LAB — BASIC METABOLIC PANEL
BUN: 14 mg/dL (ref 6–23)
CO2: 16 mEq/L — ABNORMAL LOW (ref 19–32)
Calcium: 7.9 mg/dL — ABNORMAL LOW (ref 8.4–10.5)
Chloride: 112 mEq/L (ref 96–112)
Creatinine, Ser: 0.77 mg/dL (ref 0.50–1.10)
GFR calc non Af Amer: 77 mL/min — ABNORMAL LOW (ref >90)
GFR, EST AFRICAN AMERICAN: 90 mL/min — AB (ref >90)
GLUCOSE: 162 mg/dL — AB (ref 70–99)
POTASSIUM: 3.1 meq/L — AB (ref 3.7–5.3)
Sodium: 144 mEq/L (ref 137–147)

## 2014-03-23 LAB — POCT I-STAT 3, ART BLOOD GAS (G3+)
Acid-base deficit: 6 mmol/L — ABNORMAL HIGH (ref 0.0–2.0)
Bicarbonate: 18.9 mEq/L — ABNORMAL LOW (ref 20.0–24.0)
O2 Saturation: 88 %
PCO2 ART: 38 mmHg (ref 35.0–45.0)
PH ART: 7.315 — AB (ref 7.350–7.450)
Patient temperature: 102.6
TCO2: 20 mmol/L (ref 0–100)
pO2, Arterial: 66 mmHg — ABNORMAL LOW (ref 80.0–100.0)

## 2014-03-23 LAB — CBC
HEMATOCRIT: 30.8 % — AB (ref 36.0–46.0)
HEMOGLOBIN: 9.1 g/dL — AB (ref 12.0–15.0)
MCH: 21.7 pg — ABNORMAL LOW (ref 26.0–34.0)
MCHC: 29.5 g/dL — ABNORMAL LOW (ref 30.0–36.0)
MCV: 73.3 fL — ABNORMAL LOW (ref 78.0–100.0)
Platelets: 109 10*3/uL — ABNORMAL LOW (ref 150–400)
RBC: 4.2 MIL/uL (ref 3.87–5.11)
RDW: 22.2 % — ABNORMAL HIGH (ref 11.5–15.5)
WBC: 20.1 10*3/uL — AB (ref 4.0–10.5)

## 2014-03-23 LAB — GLUCOSE, CAPILLARY
GLUCOSE-CAPILLARY: 136 mg/dL — AB (ref 70–99)
GLUCOSE-CAPILLARY: 121 mg/dL — AB (ref 70–99)
GLUCOSE-CAPILLARY: 155 mg/dL — AB (ref 70–99)
Glucose-Capillary: 233 mg/dL — ABNORMAL HIGH (ref 70–99)
Glucose-Capillary: 140 mg/dL — ABNORMAL HIGH (ref 70–99)

## 2014-03-23 LAB — MRSA PCR SCREENING: MRSA by PCR: POSITIVE — AB

## 2014-03-23 LAB — STREP PNEUMONIAE URINARY ANTIGEN: STREP PNEUMO URINARY ANTIGEN: POSITIVE — AB

## 2014-03-23 MED ORDER — AMIODARONE HCL 200 MG PO TABS
200.0000 mg | ORAL_TABLET | Freq: Two times a day (BID) | ORAL | Status: DC
  Administered 2014-03-23: 200 mg via ORAL
  Filled 2014-03-23 (×5): qty 1

## 2014-03-23 MED ORDER — FUROSEMIDE 10 MG/ML IJ SOLN
60.0000 mg | Freq: Once | INTRAMUSCULAR | Status: AC
  Administered 2014-03-23: 60 mg via INTRAVENOUS

## 2014-03-23 MED ORDER — PREDNISONE 20 MG PO TABS
40.0000 mg | ORAL_TABLET | Freq: Every day | ORAL | Status: DC
  Filled 2014-03-23 (×3): qty 2

## 2014-03-23 MED ORDER — METOPROLOL TARTRATE 25 MG PO TABS
25.0000 mg | ORAL_TABLET | Freq: Two times a day (BID) | ORAL | Status: DC
  Administered 2014-03-23 – 2014-03-25 (×3): 25 mg via ORAL
  Filled 2014-03-23 (×5): qty 1

## 2014-03-23 MED ORDER — ALBUTEROL SULFATE (2.5 MG/3ML) 0.083% IN NEBU: 2.5000 mg | INHALATION_SOLUTION | Freq: Four times a day (QID) | RESPIRATORY_TRACT | Status: DC

## 2014-03-23 MED ORDER — IPRATROPIUM-ALBUTEROL 0.5-2.5 (3) MG/3ML IN SOLN
3.0000 mL | RESPIRATORY_TRACT | Status: DC
  Administered 2014-03-23 – 2014-03-26 (×17): 3 mL via RESPIRATORY_TRACT
  Filled 2014-03-23 (×18): qty 3

## 2014-03-23 MED ORDER — ACETAMINOPHEN 325 MG PO TABS
650.0000 mg | ORAL_TABLET | Freq: Once | ORAL | Status: AC
  Administered 2014-03-23: 650 mg via ORAL
  Filled 2014-03-23: qty 2

## 2014-03-23 MED ORDER — SODIUM CHLORIDE 0.9 % IV SOLN
500.0000 mg | Freq: Two times a day (BID) | INTRAVENOUS | Status: DC
  Administered 2014-03-23 – 2014-03-26 (×6): 500 mg via INTRAVENOUS
  Filled 2014-03-23 (×11): qty 500

## 2014-03-23 MED ORDER — MUPIROCIN 2 % EX OINT
1.0000 "application " | TOPICAL_OINTMENT | Freq: Two times a day (BID) | CUTANEOUS | Status: DC
  Administered 2014-03-23 – 2014-03-26 (×6): 1 via NASAL
  Filled 2014-03-23: qty 22

## 2014-03-23 MED ORDER — FUROSEMIDE 10 MG/ML IJ SOLN
INTRAMUSCULAR | Status: AC
  Filled 2014-03-23: qty 8

## 2014-03-23 MED ORDER — POTASSIUM CHLORIDE CRYS ER 20 MEQ PO TBCR
20.0000 meq | EXTENDED_RELEASE_TABLET | ORAL | Status: AC
  Administered 2014-03-23 – 2014-03-24 (×3): 20 meq via ORAL
  Filled 2014-03-23 (×3): qty 1

## 2014-03-23 MED ORDER — LORAZEPAM 0.5 MG PO TABS
0.5000 mg | ORAL_TABLET | Freq: Every day | ORAL | Status: DC
  Administered 2014-03-23 – 2014-03-25 (×3): 0.5 mg via ORAL
  Filled 2014-03-23 (×3): qty 1

## 2014-03-23 MED ORDER — POTASSIUM CHLORIDE 20 MEQ PO PACK: 20.0000 meq | PACK | ORAL | Status: DC

## 2014-03-23 MED ORDER — PIPERACILLIN-TAZOBACTAM 3.375 G IVPB
3.3750 g | Freq: Three times a day (TID) | INTRAVENOUS | Status: DC
  Administered 2014-03-23 – 2014-03-26 (×9): 3.375 g via INTRAVENOUS
  Filled 2014-03-23 (×12): qty 50

## 2014-03-23 MED ORDER — STARCH (THICKENING) PO POWD
ORAL | Status: DC | PRN
  Filled 2014-03-23: qty 227

## 2014-03-23 MED ORDER — CHLORHEXIDINE GLUCONATE CLOTH 2 % EX PADS
6.0000 | MEDICATED_PAD | Freq: Every day | CUTANEOUS | Status: DC
  Administered 2014-03-24 – 2014-03-26 (×3): 6 via TOPICAL

## 2014-03-23 NOTE — Progress Notes (Signed)
30 mins vital signs after ditiazem was started.

## 2014-03-23 NOTE — Progress Notes (Signed)
ANTIBIOTIC CONSULT NOTE - INITIAL  Pharmacy Consult for vancomycin and zosyn Indication: HCAP  No Known Allergies  Patient Measurements: Weight: 141 lb 14.4 oz (64.365 kg)   Vital Signs: Temp: 99.9 F (37.7 C) (04/11 0548) Temp src: Oral (04/11 0132) BP: 103/68 mmHg (04/11 0548) Pulse Rate: 109 (04/11 0548) Intake/Output from previous day:   Intake/Output from this shift:    Labs:  Recent Labs  03/21/14 0700 03/22/14 0557 03/23/14 0500  WBC 14.4* 16.8* 20.1*  HGB 10.0* 9.9* 9.1*  PLT 135* 126* 109*  CREATININE 0.85 0.89 0.77   CrCl is unknown because there is no height on file for the current visit. No results found for this basename: VANCOTROUGH, VANCOPEAK, VANCORANDOM, GENTTROUGH, GENTPEAK, GENTRANDOM, TOBRATROUGH, TOBRAPEAK, TOBRARND, AMIKACINPEAK, AMIKACINTROU, AMIKACIN,  in the last 72 hours   Microbiology: No results found for this or any previous visit (from the past 720 hour(s)).  Medical History: Past Medical History  Diagnosis Date  . Atrial fibrillation   . COPD (chronic obstructive pulmonary disease)   . CHF (congestive heart failure)   . Cancer     skin  . Hypertension   . Shortness of breath    Assessment: Patient is an 80 y.o F on currently on levaquin for PNA.  Tmax 101.7 and wbc continues to trend up to 20.1.  To add vancomycin and zosyn for broad coverage. Scr 0.77 (est crcl~45).  LQ 4/8>> vanc 4/11>> Zosyn 4/11>>  4/09 UCX>> pending  Goal of Therapy:  Vancomycin trough level 15-20 mcg/ml  Plan:  1) zosyn 3.375gm IV q8h (infuse over 4 hours) 2) vancomycin 500mg  IV q12h  Montrae Braithwaite P Francee Setzer 03/23/2014,11:23 AM

## 2014-03-23 NOTE — Progress Notes (Signed)
Vital signs before ditiazem drip was started

## 2014-03-23 NOTE — Progress Notes (Signed)
Pt's heart rate has been sustaining in the 130, 140 s, she has been in A-Fib, also pt has a temp of 101.7 and did not have any prn tylenol order for fever. MD on call has been notified, order given to start pt on dilt. Drip for the increased heart rate. Concerning pt's temp MD stated that pt is already on antibiotic. Will continue to monitor pt.----Carilyn Woolston, rn

## 2014-03-23 NOTE — Progress Notes (Signed)
Family Medicine Teaching Service Daily Progress Note Intern Pager: [phone]  Patient name: Stacy Pittman Medical record number: [id number] Date of birth: 09-17-1932 Age: 80 y.o. Gender: female  Primary Care Provider: Margarita Rana, MD Consultants: None Code Status: DNR  Pt Overview and Major Events to Date:  4/7: LOC w/ seizure-like activity and left side weakness 4/8: Extensive infarction throughout the cerebellum 4/9: CIR recommended; awaiting evaluation 4/10: Afib w/ RVR - dilt drip, restarted amnio PO, CXR Inc perihilar infiltration RUL  Assessment and Plan: Stacy Pittman is a 80 y.o. female presenting with loss of consciousness and TIA symptoms . PMH is significant for HTN, COPD, HLD, afib, and CHF.   # CVA w/ seizure-like activity - EEG: normal, no epileptiform  - MRI/MRA   Extensive infarction throughout the cerebellum, mostly in the region of the superior cerebellum but with some involvement inferiorly on the right.   Swelling with mass effect upon the fourth ventricle but no ventricular obstruction at this moment. No sign of hemorrhage.   Extensive chronic small vessel disease throughout the cerebral hemispheric white matter.   Chronic right ICA occlusion. Left ICA sufficiently patent. Both vertebral arteries are patent.  -neuro consulted f/u their recs   Switched to Eliquis 4/9 - ECHO  akinesis of the apex and distal septal wall. The Study is inadequate to rule out apical thrombus by this study. Recommend Cardiac MRI to further delineate for LV thrombus  EF 37-85%, Diastolic dysfcn grade 3  Pulmonary HTN: PA pressure = 57 -PT recs: SNF; OT recs CIR  Awaiting CIR Insurance approval -SLP: Dysphagia 3 (mechanical soft, thin liquid) - Lipitor inc to 40 mg qhs - Ativan 0.5 qhs; attempting to taper off benzo, but did have seizure-like activity on admission likely most likely due to Stroke  - On Xanax 0.5mg  qhs at home but reports taking two nightly; Held last night w/  inc SOB   # ID: Leukocytosis; UA suspicious for UTI; RUL Rhonchi w/ repeat CXR showing Inc perihilar infiltration RUL. Abd soft and non-tender.  - Temp: 101.7 on 4/10 @ midnight - WBC to 20.1 (4/11) > 16.8 (4/10) > 14.4 (4/9) > 12.9 (4/8) - UA: Many bacteria, Nit and Leu +; Urine culture: not obtained - though called lab to request it - Check Urine Legionella & Strep antigens - Abx: Broaden to Vanc/Zosyn (4/11>>) to cover PNA and UTI given increasing WBC and continued fevers on Levaquin (4/9 >>4/11)  # COPD: on home O2 2L [place]. Patient uses Advair and albuterol at home - O2 requirement inc to 6L; Likely due to PNA vs possible COPD exacerbation - Dulera BID - scheduled duonebs q6hrs  - monitor respiratory status, and continue Abx as above - Prednisone 40mg  for possibe COPD exacerbation   # Afib: rate controlled on admission - Switched to Eliquis - home amiodarone got discontinued, and restarted 4/10 - Dilt drip - RVR ~ 110s now, anticipate rate control with amiodarone once off dilt drip - Will consult cardiology today  # Anemia: Hgb 11.3 on admission. No prior values noted. This is microcytic. No mention of bleeding on interview.  -Hgb: 9.1 (4/11) < 9.9 (4/10) < 10 (4/9) 11.3 on 4/7 -monitor for bleeding  - FOBT: to be obtained - Will start PO iron on discharge  # AKI on CKD: Resolved - Cr 0.89 (4/10) < 1.16 with GFR 43. No mention of kidney disease on discussion with family.   FEN/GI: Dys 3 Diet; KVO Prophylaxis: On Eliquis  Disposition: pending neuro recommendations  Subjective:  Some SOB today, but continue to denies CP. Still has some dysuria.   Objective: Temp:  [97.2 F (36.2 C)-101.7 F (38.7 C)] 99.9 F (37.7 C) (04/11 0548) Pulse Rate:  [86-134] 109 (04/11 0548) Resp:  [16-20] 20 (04/11 0548) BP: (100-136)/(37-74) 103/68 mmHg (04/11 0548) SpO2:  [88 %-96 %] 90 % (04/11 0548)  Physical Exam: General: Laying in bed, in no acute distress Cardiovascular: Irreg,  Irreg rhythm w/ RVR ~ 110s, no murmurs, distal pulses intact Respiratory: RUL Rhonchi; O2 [place] @ 6L Abdomen: soft, non-tender, non-distended Extremities: warm, no calf tenderness  Laboratory:  Recent Labs Lab 03/21/14 0700 03/22/14 0557 03/23/14 0500  WBC 14.4* 16.8* 20.1*  HGB 10.0* 9.9* 9.1*  HCT 33.0* 32.9* 30.8*  PLT 135* 126* PENDING    Recent Labs Lab 04/02/2014 1651  03/21/14 0700 03/22/14 0557 03/23/14 0500  NA 140  < > 143 144 144  K 4.3  < > 4.4 3.8 3.1*  CL 102  < > 107 108 112  CO2 22  < > 20 19 16*  BUN 25*  < > 20 18 14   CREATININE 1.16*  < > 0.85 0.89 0.77  CALCIUM 8.9  < > 8.6 8.7 7.9*  PROT 6.9  --   --   --   --   BILITOT 0.5  --   --   --   --   ALKPHOS 80  --   --   --   --   ALT 24  --   --   --   --   AST 40*  --   --   --   --   GLUCOSE 116*  < > 139* 134* 162*  < > = values in this interval not displayed. Cardiac Panel (last 3 results) No results found for this basename: CKTOTAL, CKMB, TROPONINI, RELINDX,  in the last 72 hours D-Dimer: 0.70  Imaging/Diagnostic Tests:  Dg Chest 2 View  03/25/2014 IMPRESSION: 1. Cardiac enlargement and chronic interstitial coarsening. 2. Atherosclerosis.   Electronically Signed   By: Kerby Moors M.D.   On: 03/18/2014 17:28   Ct Head Wo Contrast  04/02/2014 IMPRESSION: 1. Small vessel ischemic disease and brain atrophy. 2. No acute intracranial abnormalities   Electronically Signed   By: Kerby Moors M.D.   On: 03/29/2014 17:21   Ct Angio Chest Pe W/cm &/or Wo Cm  03/20/2014 IMPRESSION: 1. No evidence of pulmonary embolus. 2. Trace bilateral pleural effusions noted. Bibasilar airspace opacification may reflect atelectasis or pneumonia. Interstitial prominence also seen; this could reflect minimal interstitial edema. 3. Underlying emphysema noted bilaterally. 4. Mild biatrial enlargement seen. 5. Diffuse coronary artery calcifications seen.   Electronically Signed   By: Garald Balding M.D.   On: 03/20/2014 03:37    MRI HEAD FINDINGS 03/20/14 There are large regions of acute infarction within the superior  cerebellum on both sides. Some involvement of the inferior  cerebellum is present on the right. The region of infarction shows  swelling with mass effect upon the fourth ventricle. Ventricular  size is unchanged at this moment, but with further swelling there  could be obstructive hydrocephalus. No acute infarction in the  supratentorial brain. There are extensive chronic small vessel  changes throughout the white matter. No large vessel territory supra  tentorial infarction. No mass lesion. No extra-axial collection. No  pituitary mass. No inflammatory sinus disease. No skull or skullbase  lesion.  MRA HEAD FINDINGS  The right internal carotid  artery is occluded to the level of the  skullbase. Left internal carotid artery shows atherosclerotic  irregularity in the carotid siphon region but no stenosis. The left  anterior and middle cerebral vessels show mild atherosclerotic  irregularity but are widely patent. Patent anterior communicating  artery give supply to the anterior circulation on the right, and  there is fairly normal visualization of the right anterior and  middle cerebral artery. Also contributing is a patent posterior  communicating artery on this side.  Both vertebral arteries are patent with the left being diminutive in  the right being dominant. No basilar stenosis. Left PICA is patent.  Right PICA not demonstrated. Anterior inferior cerebellar arteries  are present bilaterally. There is some flow in the superior  cerebellar arteries bilaterally. Both posterior cerebral arteries  show flow and good distal opacification.  IMPRESSION:  Extensive infarction throughout the cerebellum, mostly in the region  of the superior cerebellum but with some involvement inferiorly on  the right. Swelling with mass effect upon the fourth ventricle but  no ventricular obstruction at this  moment. No sign of hemorrhage.  Extensive chronic small vessel disease throughout the cerebral  hemispheric white matter.  Chronic right ICA occlusion. Left ICA sufficiently patent with  atherosclerotic irregularity in the siphon region. Good flow  demonstrated in both the anterior and middle cerebral artery  territories because of patent anterior and posterior communicating  arteries.  Both vertebral arteries are patent. All major posterior circulation  branches are demonstrated presently with the exception of right  PICA. This includes flow within both superior cerebellar arteries at  this time.   PORTABLE CHEST - 1 VIEW  COMPARISON: 03/17/2014  FINDINGS:  Cardiac enlargement. There is increased patchy airspace disease in  the right lung with small but increasing bilateral pleural  effusions. Calcification of the aorta. No pneumothorax.  IMPRESSION:  Increasing perihilar infiltration on the right in the increasing  bilateral pleural effusions.   Phill Myron, MD 03/23/2014, 8:12 AM PGY-1, [place] Intern pager: [phone], text pages welcome

## 2014-03-23 NOTE — Progress Notes (Signed)
Pt's oxygen saturation has been in the 80s, 70s and occasionally 60s all day, and was put on a non-re breather which did not help much because pt's oxygen was still in the 80s, MD came to see pt and ordered for pt to be transferred to step-down for bi-pap. Report was called and pt was transferred.----Leniyah Martell, rn

## 2014-03-23 NOTE — Progress Notes (Signed)
PGY-2 Interim Note  Noted positive strep pneumo urinary antigen. Pt with increased WOB as noted in previous notes, as well as fever through abx. Discussed with pharmacy, with their recommendation being to remain with vanc/Zosyn for now. (pharmacist astutely brought up the point that while antigen is positive, there is no guarantee that strep pneumo is the main or only cause for her current condition); vanc will cover MRSA and Zosyn will cover strep pneumo as well as Pseudomonas or other Gram negative pathogens. Pt from home without obvious risk factors for MDR organism, so no current indication for switching away from Zosyn (such as to cefepime) or adding other coverage. Regardless, will monitor pt's vitals closely and treat fever as needed, as well.  Emmaline Kluver, MD PGY-2, [facility] 03/23/2014, 10:23 PM [place] Service pager: [phone] (text pages welcome through [facility])

## 2014-03-23 NOTE — Plan of Care (Signed)
Problem: Progression Outcomes Goal: Bowel & Bladder Continence Outcome: Not Met (add Reason) Continues to be incontinent of bladder

## 2014-03-23 NOTE — Progress Notes (Signed)
PGY-2 Interim Note  Paged by RN. Pt was on 6L [place] with sats in the upper 70's. RN concerned that pt carries diagnosis of COPD and increased O2 may have suppressed her breathing drive; order for O2 is for 2L by [place]. RN states pt is comfortable-appearing, not in distress, not working hard to breathe. O2 reduced back to 2L by [place] by RN, and O2 sats are improving. Will continue to monitor; if sats do not improve or if breathing status worsens despite adjusting O2, may consider consulting PCCM. Otherwise continue per previous notes.  Emmaline Kluver, MD PGY-2, [facility] 03/23/2014, 5:30 PM [place] Service pager: [phone] (text pages welcome through [facility])

## 2014-03-23 NOTE — Progress Notes (Signed)
Stroke Team Progress Note  HISTORY Kelissa Merlin is a 80 y.o. female with a history of melanoma on the face in 2003 as well as atrial fibrillation on Coumadin with a subtherapeutic INR. Per the referring physician, the patient was in the kitchen and became lightheaded. The patient states that she remembers becoming lightheaded, but then feels that she passed out for a period of time. She does remember EMS arriving at her house, and feels that she was confused. She continues to be very soft spoken and sleepy.  She initially was reported to have left hemiparesis, but this has rapidly improved.She has a mildly disconjugate gaze which the granddaughter reports is new. She presented to eh ED 03/29/2014 at 1636. She was LKW was unclear. Patient was not administered TPA secondary to seizure onset,stroke was not initially suspected, rapidly improving symptoms. She was admitted for further evaluation and treatment.  SUBJECTIVE No family is at the bedside.  Overall she feels her condition is stable. Complains of frontal headache, some dizziness and queasiness.  OBJECTIVE Most recent Vital Signs: Filed Vitals:   03/23/14 0205 03/23/14 0313 03/23/14 0436 03/23/14 0548  BP: 117/70 122/72 109/69 103/68  Pulse: 110 116 108 109  Temp:    99.9 F (37.7 C)  TempSrc:      Resp:    20  Weight:      SpO2:    90%   CBG (last 3)   Recent Labs  03/22/14 1711 03/22/14 2025 03/23/14 0741  GLUCAP 220* 134* 233*    IV Fluid Intake:   . sodium chloride 75 mL/hr at 03/23/14 0120  . diltiazem (CARDIZEM) infusion 5 mg/hr (03/23/14 0103)    MEDICATIONS  . ALPRAZolam  0.5 mg Oral QHS  . amiodarone  200 mg Oral Daily  . apixaban  5 mg Oral BID  . atorvastatin  40 mg Oral Daily  . ipratropium-albuterol  3 mL Nebulization Q4H  . levofloxacin  750 mg Oral Q48H  . mometasone-formoterol  2 puff Inhalation BID  . senna  1 tablet Oral Daily  . sodium chloride  500 mL Intravenous Once   PRN:    Diet:  Dysphagia 3  thin liquids Activity:   Bathroom privileges with assistance DVT Prophylaxis:  warfarin  CLINICALLY SIGNIFICANT STUDIES Basic Metabolic Panel:   Recent Labs Lab 03/22/14 0557 03/23/14 0500  NA 144 144  K 3.8 3.1*  CL 108 112  CO2 19 16*  GLUCOSE 134* 162*  BUN 18 14  CREATININE 0.89 0.77  CALCIUM 8.7 7.9*   Liver Function Tests:   Recent Labs Lab 03/31/2014 1651  AST 40*  ALT 24  ALKPHOS 80  BILITOT 0.5  PROT 6.9  ALBUMIN 3.2*   CBC:   Recent Labs Lab 03/22/14 0557 03/23/14 0500  WBC 16.8* 20.1*  HGB 9.9* 9.1*  HCT 32.9* 30.8*  MCV 73.3* 73.3*  PLT 126* 109*   Coagulation:   Recent Labs Lab 04/01/2014 1651 03/20/14 0422 03/21/14 0700  LABPROT 16.4* 17.2* 16.9*  INR 1.36 1.44 1.41   Cardiac Enzymes:   Recent Labs Lab 03/23/2014 2048 03/20/14 0422 03/20/14 0800  TROPONINI <0.30 <0.30 <0.30   Urinalysis:   Recent Labs Lab 03/21/14 0952  COLORURINE YELLOW  LABSPEC 1.027  PHURINE 5.5  GLUCOSEU NEGATIVE  HGBUR NEGATIVE  BILIRUBINUR NEGATIVE  KETONESUR NEGATIVE  PROTEINUR NEGATIVE  UROBILINOGEN 0.2  NITRITE POSITIVE*  LEUKOCYTESUR LARGE*   Lipid Panel    Component Value Date/Time   CHOL 94 03/20/2014 0422  TRIG 66 03/20/2014 0422   HDL 34* 03/20/2014 0422   CHOLHDL 2.8 03/20/2014 0422   VLDL 13 03/20/2014 0422   LDLCALC 47 03/20/2014 0422   HgbA1C  Lab Results  Component Value Date   HGBA1C 6.2* 04/03/14    Urine Drug Screen:      Component Value Date/Time   LABOPIA NONE DETECTED 03/21/2014 0952    Alcohol Level:   Recent Labs Lab 2014-04-03 2048  ETH <11    CT of the brain  04-03-2014    1. Small vessel ischemic disease and brain atrophy. 2. No acute intracranial abnormalities     MRI of the brain  03/20/2014   Extensive infarction throughout the cerebellum, mostly in the region of the superior cerebellum but with some involvement inferiorly on the right. Swelling with mass effect upon the fourth ventricle but no ventricular obstruction  at this moment. No sign of hemorrhage.  Extensive chronic small vessel disease throughout the cerebral hemispheric white matter  MRA of the brain  03/20/2014   Chronic right ICA occlusion. Left ICA sufficiently patent with atherosclerotic irregularity in the siphon region. Good flow demonstrated in both the anterior and middle cerebral artery territories because of patent anterior and posterior communicating arteries.  Both vertebral arteries are patent. All major posterior circulation branches are demonstrated presently with the exception of right PICA. This includes flow within both superior cerebellar arteries at this time.     2D Echocardiogram  Endocardial segments are poorly visualized  but there is a suggestion of akinesis of the apex and distal septal wall. The Study is inadequate to rule out apical thrombus by this study. EF 45-50%. The right ventricular systolic pressure was increased consistent with moderate pulmonary hypertension.  Carotid Doppler    CXR  Apr 03, 2014   1. Cardiac enlargement and chronic interstitial coarsening. 2. Atherosclerosis.     CT Angio Chest  03/20/2014   1. No evidence of pulmonary embolus. 2. Trace bilateral pleural effusions noted. Bibasilar airspace opacification may reflect atelectasis or pneumonia. Interstitial prominence also seen; this could reflect minimal interstitial edema. 3. Underlying emphysema noted bilaterally. 4. Mild biatrial enlargement seen. 5. Diffuse coronary artery calcifications seen.     EEG  This is a normal EEG. No epileptiform activity is noted.   EKG  Ectopic atrial tachycardia. For complete results please see formal report.   Therapy Recommendations SNF  Physical Exam  General: The patient is alert and cooperative at the time of the examination.  Skin: No significant peripheral edema is noted.   Neurologic Exam  Mental status: The patient is oriented x 3.  Cranial nerves: Facial symmetry is present. Speech is normal, no aphasia  or dysarthria is noted. Extraocular movements are full. Visual fields are full.  Motor: The patient has good strength in all 4 extremities.  Sensory examination: Soft touch sensation is symmetric on the face, arms, and legs.  Coordination: The patient has good finger-nose-finger and heel-to-shin on the left, the patient has dysmetria with the right arm and leg.  Gait and station: The gait was not tested.  Reflexes: Deep tendon reflexes are symmetric.   ASSESSMENT Ms. Joyelle Siedlecki is a 80 y.o. female presenting with syncope, loss of consciousness as well as left hemiparesis and dysconjugate gaze. 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 Infarcts felt to be embolic secondary to known atrial fibrillation.  On warfarin prior to admission with INR subtherapeutic at 1.36 on arrival. Now on Eliquis for  secondary stroke prevention.   atrial fibrillation, on coumadin prior to admission hypertension Hyperlipidemia, LDL 47, on lipitor 10 mg PTA, now on lipitor 40 mg, at goal LDL < 100 CHF COPD, home O2  Hospital day # 4  The patient is developing fevers, and a high white blood count, with evidence of a right perihilar infiltrate and pleural effusions. Patient may be at risk for aspiration pneumonia. The patient is on dysphagia 3 neck or thick liquid diet. Stroke workup is complete with exception that the carotid Doppler study has not been done. Plans are for CIR.  TREATMENT/PLAN  Continue eliquis for stroke prevention but discontinue aspirin  Recommend carotid doppler to complete vascular workup for potential stroke source, this is still pending  Await rehab eval and disposition, possible rehabilitation transfer Monday.   Kathrynn Ducking  03/23/2014 9:56 AM  To contact Stroke Continuity provider, please refer to [email or web address] After hours, contact General Neurology

## 2014-03-23 NOTE — Progress Notes (Signed)
Bipap set up in patients room on standby per MD for patients WOB. Upon entering unit, patient did not need Bipap at this time.

## 2014-03-23 NOTE — Progress Notes (Signed)
VASCULAR LAB PRELIMINARY  PRELIMINARY  PRELIMINARY  PRELIMINARY  Carotid Dopplers completed.    Preliminary report:  Right ICA is occluded.  There is 60-79% left ICA stenosis, highest end of scale.  Severe calcific plaque noted origin and proximal left ICA.  Vertebral artery flow is antegrade.  Iantha Fallen, RVT 03/23/2014, 11:27 AM

## 2014-03-23 NOTE — Progress Notes (Signed)
Notified Md about pts temp and ABG.  New orders received. Will continue to monitor. Wynona Canes

## 2014-03-23 NOTE — Progress Notes (Addendum)
PGY-2 Interim Note  S: Paged by RN, pt with sats in upper 70's on [place]. Improved to low 80's with nonrebreather. Pt alert and responds to questions appropriately. She denies pain or difficulty breathing and states she feels "okay."  O: BP 106/67  Pulse 115  Temp(Src) 99.4 F (37.4 C) (Oral)  Resp 20  Wt 141 lb 14.4 oz (64.365 kg)  SpO2 77% Gen: alert, in NAD but appears uncomfortable Cardio: rate slightly increased, rhythm irregularly irregular, no murmur appreciated Pulm: increased WOB with some supraclavicular retractions  Lungs sound relatively clear  A/P: Uncertain reason for worsening respiratory status. PNA still on DDx - move to stepdown for closer monitoring (2H19) - continue nonrebreather for now; will consider BiPAP tonight (will discuss with RT) - will give Lasix 60 mg IV once - continue vancomycin / Zosyn, broadened earlier today; continue scheduled Duonebs - continue daily prednisone, consider switch to IV steroids - continue diltiazem drip - may consider PCCM consult, though family have confirmed they and pt do not want aggressive measures such as intubation / CPR / etc in case of respiratory failure or cardiac arrest   Addendum: Called both son (with whom patient lives) and daughter and updated them on the above, and answered questions to their satisfaction. Both son and daughter reiterated absolutely NO intubation or ventilator in the event of respiratory arrest. Also spoke with Dr. Wendy Poet, who is in agreement with the above.  Daughter: Costella Hatcher [phone] Son: Broadus John - Cell [phone]

## 2014-03-23 NOTE — Consult Note (Signed)
CARDIOLOGY CONSULT NOTE  Patient ID: Stacy Pittman, MRN: [id number], DOB/AGE: 12-17-1931 80 y.o. Admit date: 04/09/2014 Date of Consult: 03/23/2014  Primary Physician: Margarita Rana, MD Primary Cardiologist: new  Chief Complaint:  Stroke and atrial fibrillation   HPI Stacy Pittman is a 80 y.o. female  Whose past medical history is not well known who was admitted following a fall associated with questionable loss of consciousness and jerking movements confusion and left hemiparesis. Her evaluation demonstrated by MRI extensive infarction in her cerebellum and chronic right ICA occlusion. EEG evaluation was negative for seizures.  She has a known history of atrial fibrillation although more details are not available. She was on Coumadin on admission but had a subtherapeutic INR.  Initial tracing is hard to interpret>> on the baseline artifact I I can only say that was regular and a little bit fast. However, yesterday (4/10) at about 1600 hours she developed atrial fibrillation from what was clearly sinus rhythm.  Cardiac evaluation has also included an ultrasound which demonstrated an ejection fraction of 45-50%. There was akinesis of the apex.  Past Medical History  Diagnosis Date  . Atrial fibrillation   . COPD (chronic obstructive pulmonary disease)   . CHF (congestive heart failure)   . Melanoma of face     Surgical resection  . Hypertension   . Shortness of breath   . Stroke     Cerebellar 4/15      Surgical History:  Past Surgical History  Procedure Laterality Date  . Skin cancer excision    . Tubal ligation    . Cataract extraction       Home Meds: Prior to Admission medications   Medication Sig Start Date End Date Taking? Authorizing Provider  albuterol (PROVENTIL HFA;VENTOLIN HFA) 108 (90 BASE) MCG/ACT inhaler Inhale 2 puffs into the lungs every 6 (six) hours as needed for wheezing or shortness of breath.   Yes Historical Provider, MD  ALPRAZolam Duanne Moron) 0.5  MG tablet Take 0.5 mg by mouth 3 (three) times daily.   Yes Historical Provider, MD  amiodarone (PACERONE) 200 MG tablet Take 200 mg by mouth daily.   Yes Historical Provider, MD  aspirin 81 MG tablet Take 81 mg by mouth daily.   Yes Historical Provider, MD  atorvastatin (LIPITOR) 10 MG tablet Take 10 mg by mouth daily.   Yes Historical Provider, MD  furosemide (LASIX) 20 MG tablet Take 10 mg by mouth daily.    Yes Historical Provider, MD  gabapentin (NEURONTIN) 300 MG capsule Take 300 mg by mouth 3 (three) times daily.   Yes Historical Provider, MD  lisinopril (PRINIVIL,ZESTRIL) 10 MG tablet Take 10 mg by mouth daily.   Yes Historical Provider, MD  mirtazapine (REMERON) 15 MG tablet Take 15 mg by mouth at bedtime.   Yes Historical Provider, MD  OXYGEN-HELIUM IN Inhale 2 L into the lungs at bedtime.   Yes Historical Provider, MD  potassium chloride (K-DUR) 10 MEQ tablet Take 10 mEq by mouth 2 (two) times daily.    Yes Historical Provider, MD  tiotropium (SPIRIVA) 18 MCG inhalation capsule Place 18 mcg into inhaler and inhale daily.    Yes Historical Provider, MD  warfarin (COUMADIN) 1 MG tablet Take 1-1.5 mg by mouth daily. 1mg  daily, except on Mon/Thus 1.5mg    Yes Historical Provider, MD    Inpatient Medications:  . amiodarone  200 mg Oral Daily  . apixaban  5 mg Oral BID  . atorvastatin  40 mg Oral  Daily  . ipratropium-albuterol  3 mL Nebulization Q4H  . levofloxacin  750 mg Oral Q48H  . LORazepam  0.5 mg Oral QHS  . mometasone-formoterol  2 puff Inhalation BID  . piperacillin-tazobactam (ZOSYN)  IV  3.375 g Intravenous Q8H  . [START ON 03/24/2014] predniSONE  40 mg Oral Q breakfast  . senna  1 tablet Oral Daily  . sodium chloride  500 mL Intravenous Once  . vancomycin  500 mg Intravenous Q12H    Allergies: No Known Allergies  History   Social History  . Marital Status: Widowed    Spouse Name: N/A    Number of Children: N/A  . Years of Education: N/A   Occupational History  .  Not on file.   Social History Main Topics  . Smoking status: Former Smoker    Quit date: 12/13/2001  . Smokeless tobacco: Never Used  . Alcohol Use: No  . Drug Use: No  . Sexual Activity: Not on file   Other Topics Concern  . Not on file   Social History Narrative  . No narrative on file     History reviewed. No pertinent family history. unable to give history  ROS:  Please see the history of present illness.   Unable to give history   All other systems reviewed and negative.    Physical Exam:   Blood pressure 103/68, pulse 109, temperature 99.9 F (37.7 C), temperature source Oral, resp. rate 20, weight 141 lb 14.4 oz (64.365 kg), SpO2 88.00%. General: Well developed, and quite cachectic elderly age appearing female in no acute distress. Head: Normocephalic, there is a surgical scar on the right cheek.  EENT: normal  Lymph Nodes:  none Neck: Negative for carotid bruits. JVD cannot be looked at in the right side of her neck and I didn't see anything on the left. Back: Not examined Lungs: Clear laterally to auscultation without wheezes, rales, or rhonchi. Breathing is unlabored. Heart: Rapid and irregular rate and rhythm Abdomen: Soft, non-tender, non-distended with normoactive bowel sounds. No hepatomegaly. No rebound/guarding. No obvious abdominal masses. Msk:  Strength and tone appear normal for age. Extremities: No clubbing or cyanosis. No  edema.  Distal pedal pulses are 2+ and equal bilaterally. Skin: Warm and Dry Neuro: Hard to assess as her verbal response is sluggish and not very clear she is lying in bed and is moving all 4 extremities. Psych:  Responds to questions appropriately with a flat and somewhat sad affect     Labs: Cardiac Enzymes No results found for this basename: CKTOTAL, CKMB, TROPONINI,  in the last 72 hours CBC Lab Results  Component Value Date   WBC 20.1* 03/23/2014   HGB 9.1* 03/23/2014   HCT 30.8* 03/23/2014   MCV 73.3* 03/23/2014   PLT 109*  03/23/2014   PROTIME:  Recent Labs  03/21/14 0700  LABPROT 16.9*  INR 1.41   Chemistry  Recent Labs Lab 04/02/2014 1651  03/23/14 0500  NA 140  < > 144  K 4.3  < > 3.1*  CL 102  < > 112  CO2 22  < > 16*  BUN 25*  < > 14  CREATININE 1.16*  < > 0.77  CALCIUM 8.9  < > 7.9*  PROT 6.9  --   --   BILITOT 0.5  --   --   ALKPHOS 80  --   --   ALT 24  --   --   AST 40*  --   --  GLUCOSE 116*  < > 162*  < > = values in this interval not displayed. Lipids Lab Results  Component Value Date   CHOL 94 03/20/2014   HDL 34* 03/20/2014   LDLCALC 47 03/20/2014   TRIG 66 03/20/2014   BNP Pro B Natriuretic peptide (BNP)  Date/Time Value Ref Range Status  03/31/2014  4:51 PM 789.5* 0 - 450 pg/mL Final   Miscellaneous Lab Results  Component Value Date   DDIMER 0.70* 04/09/2014    Radiology/Studies:  Dg Chest 2 View  03/15/2014   CLINICAL DATA:  Syncope.  EXAM: CHEST  2 VIEW  COMPARISON:  None  FINDINGS: Moderate cardiac enlargement. No pleural effusion. Chronic interstitial coarsening noted bilaterally. Calcified atherosclerotic change involves the thoracic aorta. No airspace consolidation. The bones appear osteopenic.  IMPRESSION: 1. Cardiac enlargement and chronic interstitial coarsening. 2. Atherosclerosis.   Electronically Signed   By: Kerby Moors M.D.   On: 04/11/2014 17:28   Ct Head Wo Contrast  04/06/2014   CLINICAL DATA:  Mental status changes.  EXAM: CT HEAD WITHOUT CONTRAST  TECHNIQUE: Contiguous axial images were obtained from the base of the skull through the vertex without intravenous contrast.  COMPARISON:  None.  FINDINGS: There is diffuse low attenuation throughout the subcortical and periventricular white matter bilaterally compatible with chronic small vessel ischemic disease. There is prominence of the sulci and ventricles compatible with brain atrophy. No acute cortical infarct, hemorrhage, or mass lesion ispresent. No significant extra-axial fluid collection is present. There  is mild mucosal thickening involving the posterior aspect of the right maxillary sinus. The paranasal sinuses are clear. The mastoid air cells are clear. The skull is intact.  IMPRESSION: 1. Small vessel ischemic disease and brain atrophy. 2. No acute intracranial abnormalities   Electronically Signed   By: Kerby Moors M.D.   On: 03/31/2014 17:21   Ct Angio Chest Pe W/cm &/or Wo Cm  03/20/2014   CLINICAL DATA:  Weakness and elevated D-dimer.  EXAM: CT ANGIOGRAPHY CHEST WITH CONTRAST  TECHNIQUE: Multidetector CT imaging of the chest was performed using the standard protocol during bolus administration of intravenous contrast. Multiplanar CT image reconstructions and MIPs were obtained to evaluate the vascular anatomy.  CONTRAST:  51mL OMNIPAQUE IOHEXOL 350 MG/ML SOLN  COMPARISON:  Chest radiograph performed 03/29/2014, and CTA of the chest performed 12/10/2013  FINDINGS: There is no evidence of pulmonary embolus.  Trace bilateral pleural effusions are noted. Bibasilar airspace opacification may reflect atelectasis or pneumonia. Underlying emphysema is noted bilaterally. Interstitial prominence is also noted; this could reflect minimal interstitial edema. There is no evidence of pneumothorax. No masses are identified; no abnormal focal contrast enhancement is seen.  Mild biatrial enlargement is noted. Diffuse coronary artery calcifications are seen. Calcification is seen along the thoracic aorta and proximal great vessels. No pericardial effusion is identified. No mediastinal lymphadenopathy is seen. No axillary lymphadenopathy is seen. Tiny nonspecific hypodensities are seen within the thyroid gland.  The visualized portions of the liver and spleen are unremarkable.  No acute osseous abnormalities are seen.  Review of the MIP images confirms the above findings.  IMPRESSION: 1. No evidence of pulmonary embolus. 2. Trace bilateral pleural effusions noted. Bibasilar airspace opacification may reflect atelectasis or  pneumonia. Interstitial prominence also seen; this could reflect minimal interstitial edema. 3. Underlying emphysema noted bilaterally. 4. Mild biatrial enlargement seen. 5. Diffuse coronary artery calcifications seen.   Electronically Signed   By: Garald Balding M.D.   On: 03/20/2014 03:37  Mri Brain Without Contrast  03/22/2014   CLINICAL DATA:  Loss of consciousness. Jerking movements. . Stroke versus seizure.  EXAM: MRI HEAD WITHOUT CONTRAST  MRA HEAD WITHOUT CONTRAST  TECHNIQUE: Multiplanar, multiecho pulse sequences of the brain and surrounding structures were obtained without intravenous contrast. Angiographic images of the head were obtained using MRA technique without contrast.  COMPARISON:  Head CT 03/21/2014. MRI 05/13/2011.  FINDINGS: MRI HEAD FINDINGS  There are large regions of acute infarction within the superior cerebellum on both sides. Some involvement of the inferior cerebellum is present on the right. The region of infarction shows swelling with mass effect upon the fourth ventricle. Ventricular size is unchanged at this moment, but with further swelling there could be obstructive hydrocephalus. No acute infarction in the supratentorial brain. There are extensive chronic small vessel changes throughout the white matter. No large vessel territory supra tentorial infarction. No mass lesion. No extra-axial collection. No pituitary mass. No inflammatory sinus disease. No skull or skullbase lesion.  MRA HEAD FINDINGS  The right internal carotid artery is occluded to the level of the skullbase. Left internal carotid artery shows atherosclerotic irregularity in the carotid siphon region but no stenosis. The left anterior and middle cerebral vessels show mild atherosclerotic irregularity but are widely patent. Patent anterior communicating artery give supply to the anterior circulation on the right, and there is fairly normal visualization of the right anterior and middle cerebral artery. Also  contributing is a patent posterior communicating artery on this side.  Both vertebral arteries are patent with the left being diminutive in the right being dominant. No basilar stenosis. Left PICA is patent. Right PICA not demonstrated. Anterior inferior cerebellar arteries are present bilaterally. There is some flow in the superior cerebellar arteries bilaterally. Both posterior cerebral arteries show flow and good distal opacification.  IMPRESSION: Extensive infarction throughout the cerebellum, mostly in the region of the superior cerebellum but with some involvement inferiorly on the right. Swelling with mass effect upon the fourth ventricle but no ventricular obstruction at this moment. No sign of hemorrhage.  Extensive chronic small vessel disease throughout the cerebral hemispheric white matter.  Chronic right ICA occlusion. Left ICA sufficiently patent with atherosclerotic irregularity in the siphon region. Good flow demonstrated in both the anterior and middle cerebral artery territories because of patent anterior and posterior communicating arteries.  Both vertebral arteries are patent. All major posterior circulation branches are demonstrated presently with the exception of right PICA. This includes flow within both superior cerebellar arteries at this time.   Electronically Signed   By: Nelson Chimes M.D.   On: 03/22/2014 10:20   Dg Chest Port 1 View  03/23/2014   CLINICAL DATA:  Right upper lung rhonchi with worsening oxygen requirement.  EXAM: PORTABLE CHEST - 1 VIEW  COMPARISON:  04/10/2014  FINDINGS: Cardiac enlargement. There is increased patchy airspace disease in the right lung with small but increasing bilateral pleural effusions. Calcification of the aorta. No pneumothorax.  IMPRESSION: Increasing perihilar infiltration on the right in the increasing bilateral pleural effusions.   Electronically Signed   By: Lucienne Capers M.D.   On: 03/23/2014 01:24   Mr Jodene Nam Head/brain Wo Cm  03/20/2014    CLINICAL DATA:  Loss of consciousness. Jerking movements. . Stroke versus seizure.  EXAM: MRI HEAD WITHOUT CONTRAST  MRA HEAD WITHOUT CONTRAST  TECHNIQUE: Multiplanar, multiecho pulse sequences of the brain and surrounding structures were obtained without intravenous contrast. Angiographic images of the head were obtained using MRA  technique without contrast.  COMPARISON:  Head CT 04/04/2014.  MRI 05/13/2011.  FINDINGS: MRI HEAD FINDINGS  There are large regions of acute infarction within the superior cerebellum on both sides. Some involvement of the inferior cerebellum is present on the right. The region of infarction shows swelling with mass effect upon the fourth ventricle. Ventricular size is unchanged at this moment, but with further swelling there could be obstructive hydrocephalus. No acute infarction in the supratentorial brain. There are extensive chronic small vessel changes throughout the white matter. No large vessel territory supra tentorial infarction. No mass lesion. No extra-axial collection. No pituitary mass. No inflammatory sinus disease. No skull or skullbase lesion.  MRA HEAD FINDINGS  The right internal carotid artery is occluded to the level of the skullbase. Left internal carotid artery shows atherosclerotic irregularity in the carotid siphon region but no stenosis. The left anterior and middle cerebral vessels show mild atherosclerotic irregularity but are widely patent. Patent anterior communicating artery give supply to the anterior circulation on the right, and there is fairly normal visualization of the right anterior and middle cerebral artery. Also contributing is a patent posterior communicating artery on this side.  Both vertebral arteries are patent with the left being diminutive in the right being dominant. No basilar stenosis. Left PICA is patent. Right PICA not demonstrated. Anterior inferior cerebellar arteries are present bilaterally. There is some flow in the superior  cerebellar arteries bilaterally. Both posterior cerebral arteries show flow and good distal opacification.  IMPRESSION: Extensive infarction throughout the cerebellum, mostly in the region of the superior cerebellum but with some involvement inferiorly on the right. Swelling with mass effect upon the fourth ventricle but no ventricular obstruction at this moment. No sign of hemorrhage.  Extensive chronic small vessel disease throughout the cerebral hemispheric white matter.  Chronic right ICA occlusion. Left ICA sufficiently patent with atherosclerotic irregularity in the siphon region. Good flow demonstrated in both the anterior and middle cerebral artery territories because of patent anterior and posterior communicating arteries.  Both vertebral arteries are patent. All major posterior circulation branches are demonstrated presently with the exception of right PICA. This includes flow within both superior cerebellar arteries at this time.   Electronically Signed   By: Nelson Chimes M.D.   On: 03/20/2014 19:32    EKG:  Atrial fib   Assessment and Plan:   Atrial fibrillation-paroxysmal with a rapid rate  Stroke-cerebellar hemorrhage  Leukocytosis and fever question pneumonia  Hypokalemia  Cardiomyopathy with ejection fraction 45%/wall motion abnormalities possibly ischemic  COPD on home oxygen  Fall question syncope on admission   The patient has recurrent atrial fibrillation with a rapid rate. This occurs in the context of fever, respiratory insufficiency, and hypokalemia and likely is related to her stroke.  Agree with apixaban although the dosing is not so easy. Her weight is 64 kg and her age of 64, a former close to the cover off of 60 kg hours she would be prescribed 2.5 mg. Still, based on the safety profile, I think 5 mg twice daily is reasonable.  As relates to the rapid rate, I would use higher doses of amiodarone to both hopefully restore as well as maintain sinus rhythm as well  as using low-dose beta blockers in the interim for rate control  We'll replete her potassium and measure her magnesium.      Deboraha Sprang

## 2014-03-24 ENCOUNTER — Inpatient Hospital Stay (HOSPITAL_COMMUNITY): Payer: Medicare HMO

## 2014-03-24 DIAGNOSIS — I4891 Unspecified atrial fibrillation: Secondary | ICD-10-CM

## 2014-03-24 DIAGNOSIS — E870 Hyperosmolality and hypernatremia: Secondary | ICD-10-CM

## 2014-03-24 DIAGNOSIS — J9600 Acute respiratory failure, unspecified whether with hypoxia or hypercapnia: Secondary | ICD-10-CM

## 2014-03-24 DIAGNOSIS — J189 Pneumonia, unspecified organism: Secondary | ICD-10-CM

## 2014-03-24 DIAGNOSIS — R404 Transient alteration of awareness: Secondary | ICD-10-CM

## 2014-03-24 LAB — MAGNESIUM: Magnesium: 1.6 mg/dL (ref 1.5–2.5)

## 2014-03-24 LAB — CBC
HEMATOCRIT: 30.2 % — AB (ref 36.0–46.0)
Hemoglobin: 9.3 g/dL — ABNORMAL LOW (ref 12.0–15.0)
MCH: 22.1 pg — ABNORMAL LOW (ref 26.0–34.0)
MCHC: 30.8 g/dL (ref 30.0–36.0)
MCV: 71.9 fL — ABNORMAL LOW (ref 78.0–100.0)
Platelets: 139 10*3/uL — ABNORMAL LOW (ref 150–400)
RBC: 4.2 MIL/uL (ref 3.87–5.11)
RDW: 22.6 % — AB (ref 11.5–15.5)
WBC: 24.4 10*3/uL — AB (ref 4.0–10.5)

## 2014-03-24 LAB — BASIC METABOLIC PANEL
BUN: 16 mg/dL (ref 6–23)
BUN: 18 mg/dL (ref 6–23)
CALCIUM: 8.4 mg/dL (ref 8.4–10.5)
CHLORIDE: 114 meq/L — AB (ref 96–112)
CO2: 21 mEq/L (ref 19–32)
CO2: 19 meq/L (ref 19–32)
Calcium: 8.5 mg/dL (ref 8.4–10.5)
Chloride: 111 mEq/L (ref 96–112)
Creatinine, Ser: 1.04 mg/dL (ref 0.50–1.10)
Creatinine, Ser: 1.14 mg/dL — ABNORMAL HIGH (ref 0.50–1.10)
GFR calc non Af Amer: 49 mL/min — ABNORMAL LOW (ref >90)
GFR, EST AFRICAN AMERICAN: 57 mL/min — AB (ref >90)
GFR, EST AFRICAN AMERICAN: 51 mL/min — AB (ref >90)
GFR, EST NON AFRICAN AMERICAN: 44 mL/min — AB (ref >90)
Glucose, Bld: 144 mg/dL — ABNORMAL HIGH (ref 70–99)
Glucose, Bld: 201 mg/dL — ABNORMAL HIGH (ref 70–99)
POTASSIUM: 4 meq/L (ref 3.7–5.3)
Potassium: 3.6 mEq/L — ABNORMAL LOW (ref 3.7–5.3)
SODIUM: 150 meq/L — AB (ref 137–147)
Sodium: 148 mEq/L — ABNORMAL HIGH (ref 137–147)

## 2014-03-24 LAB — LEGIONELLA ANTIGEN, URINE: LEGIONELLA ANTIGEN, URINE: NEGATIVE

## 2014-03-24 MED ORDER — AMIODARONE HCL IN DEXTROSE 360-4.14 MG/200ML-% IV SOLN
30.0000 mg/h | INTRAVENOUS | Status: DC
  Administered 2014-03-25 – 2014-03-26 (×4): 30 mg/h via INTRAVENOUS
  Filled 2014-03-24 (×11): qty 200

## 2014-03-24 MED ORDER — DEXTROSE 5 % IV SOLN
INTRAVENOUS | Status: DC
  Administered 2014-03-24: 13:00:00 via INTRAVENOUS

## 2014-03-24 MED ORDER — SODIUM CHLORIDE 0.9 % IV SOLN
INTRAVENOUS | Status: DC
  Administered 2014-03-24: 10 mL via INTRAVENOUS

## 2014-03-24 MED ORDER — AMIODARONE LOAD VIA INFUSION
150.0000 mg | Freq: Once | INTRAVENOUS | Status: AC
  Administered 2014-03-24: 150 mg via INTRAVENOUS
  Filled 2014-03-24: qty 83.34

## 2014-03-24 MED ORDER — POTASSIUM CHLORIDE CRYS ER 20 MEQ PO TBCR
20.0000 meq | EXTENDED_RELEASE_TABLET | ORAL | Status: DC
  Filled 2014-03-24: qty 1

## 2014-03-24 MED ORDER — AMIODARONE HCL IN DEXTROSE 360-4.14 MG/200ML-% IV SOLN
60.0000 mg/h | INTRAVENOUS | Status: AC
  Administered 2014-03-24: 60 mg/h via INTRAVENOUS
  Filled 2014-03-24 (×2): qty 200

## 2014-03-24 NOTE — Progress Notes (Signed)
Rt removed bipap for rn to give meds and give pt a small break  RT placed pt on NRB at this time.

## 2014-03-24 NOTE — Progress Notes (Signed)
Family Medicine Teaching Service Daily Progress Note Intern Pager: [phone]  Patient name: Stacy Pittman Medical record number: [id number] Date of birth: 1932/10/28 Age: 80 y.o. Gender: female  Primary Care Provider: Margarita Rana, MD Consultants: neuro, cards Code Status: DNR  Pt Overview and Major Events to Date:  4/7: LOC w/ seizure-like activity and left side weakness 4/8: Extensive infarction throughout the cerebellum 4/9: CIR recommended; awaiting evaluation 4/10: Afib w/ RVR - dilt drip, restarted amnio PO, CXR Inc perihilar infiltration RUL  Assessment and Plan: Stacy Pittman is a 80 y.o. female presenting with loss of consciousness and TIA symptoms . PMH is significant for HTN, COPD, HLD, afib, and CHF.   # CVA w/ seizure-like activity - EEG: normal, no epileptiform  - MRI/MRA   Extensive infarction throughout the cerebellum, mostly in the region of the superior cerebellum but with some involvement inferiorly on the right.   Swelling with mass effect upon the fourth ventricle but no ventricular obstruction at this moment. No sign of hemorrhage.   Extensive chronic small vessel disease throughout the cerebral hemispheric white matter.   Chronic right ICA occlusion. Left ICA sufficiently patent. Both vertebral arteries are patent.  -neuro consulted f/u their recs   Switched to Eliquis 4/9 - ECHO  akinesis of the apex and distal septal wall. The Study is inadequate to rule out apical thrombus by this study. Recommend Cardiac MRI to further delineate for LV thrombus  EF 09-38%, Diastolic dysfcn grade 3  Pulmonary HTN: PA pressure = 57 -PT recs: SNF; OT recs CIR  Awaiting CIR Insurance approval -SLP: Dysphagia 3 (mechanical soft, thin liquid) - Lipitor inc to 40 mg qhs - Ativan 0.5 qhs; attempting to taper off benzo, but did have seizure-like activity on admission most likely due to Stroke  - On Xanax 0.5mg  qhs at home but reports taking two nightly; Held last night  w/ inc SOB   # ID: Leukocytosis; UA suspicious for UTI; Right sided coarse breath sounds with worse appearing right sided infiltrate. Abd soft and non-tender.  - Temp: 101.7 on 4/10 @ midnight - WBC to 24.4 (4/12) > 20.1 (4/11) > 16.8 (4/10) > 14.4 (4/9) > 12.9 (4/8) - UA: Many bacteria, Nit and Leu +; Urine culture: not obtained - though called lab to request it - urine strep positive - Abx: Broaden to Vanc/Zosyn (4/11>>) to cover PNA and UTI given increasing WBC and continued fevers on Levaquin (4/9 >>4/11)  # COPD: on home O2 2L [place]. Patient uses Advair and albuterol at home - O2 requirement inc to non-rebreather; Likely due to PNA vs possible COPD exacerbation - Dulera BID - scheduled duonebs q6hrs  - monitor respiratory status, and continue Abx as above - Prednisone 40mg  for possibe COPD exacerbation   # Afib: rate controlled on admission - Switched to Eliquis - home amiodarone got discontinued, and restarted 4/10 - metoprolol BID, Dilt drip - RVR, anticipate rate control with amiodarone once off dilt drip - appreciate cards assistance in this case  # Anemia: Hgb 11.3 on admission. No prior values noted. This is microcytic. No mention of bleeding on interview.  - Hgb: 9.3 (4/12) < 9.1 (4/11) < 9.9 (4/10) < 10 (4/9) 11.3 on 4/7 - monitor for bleeding  - FOBT: to be obtained - Will start PO iron on discharge  # AKI on CKD:  - Cr 0.89 (4/10) < 1.16 with GFR 43. No mention of kidney disease on discussion with family.  - continue to monitor Cr  #  Hypernatremia: 150 today. - D5 W @ 100 - check BMET @ 6 pm  # Abdominal distension: mildly distended on exam today, no BM in 5 days. - will check KUB for stool burden and ?of ileus  FEN/GI: Dys 3 Diet; D5 W Prophylaxis: On Eliquis  Disposition: pending improvement in respiratory status  Subjective:  States no complaints. States is breathing fine. No pain. Nursing noted that she had increased WOB and they called respiratory  therapy to place BiPAP, RT stated she did not need it at that time.  Objective: Temp:  [99.1 F (37.3 C)-102.6 F (39.2 C)] 100.9 F (38.3 C) (04/12 0506) Pulse Rate:  [35-138] 108 (04/12 0506) Resp:  [19-33] 26 (04/12 0600) BP: (89-153)/(36-88) 153/88 mmHg (04/12 0600) SpO2:  [77 %-97 %] 97 % (04/12 0506) Weight:  [149 lb 4 oz (67.7 kg)-151 lb 0.2 oz (68.5 kg)] 151 lb 0.2 oz (68.5 kg) (04/12 0333)  Physical Exam: General: Laying in bed, in no acute distress Cardiovascular: Irreg, Irreg rhythm, no murmurs Respiratory: coarse right sided breath sounds; on non-rebreather though this was not over her nose Abdomen: soft, non-tender, mild distension Extremities: warm, minimal edema noted  Laboratory:  Recent Labs Lab 03/22/14 0557 03/23/14 0500 03/24/14 0324  WBC 16.8* 20.1* 24.4*  HGB 9.9* 9.1* 9.3*  HCT 32.9* 30.8* 30.2*  PLT 126* 109* 139*    Recent Labs Lab 04/10/2014 1651  03/22/14 0557 03/23/14 0500 03/24/14 0324  NA 140  < > 144 144 150*  K 4.3  < > 3.8 3.1* 4.0  CL 102  < > 108 112 114*  CO2 22  < > 19 16* 21  BUN 25*  < > 18 14 16   CREATININE 1.16*  < > 0.89 0.77 1.04  CALCIUM 8.9  < > 8.7 7.9* 8.5  PROT 6.9  --   --   --   --   BILITOT 0.5  --   --   --   --   ALKPHOS 80  --   --   --   --   ALT 24  --   --   --   --   AST 40*  --   --   --   --   GLUCOSE 116*  < > 134* 162* 144*  < > = values in this interval not displayed. Cardiac Panel (last 3 results) No results found for this basename: CKTOTAL, CKMB, TROPONINI, RELINDX,  in the last 72 hours D-Dimer: 0.70  Imaging/Diagnostic Tests:  Dg Chest 2 View  03/17/2014 IMPRESSION: 1. Cardiac enlargement and chronic interstitial coarsening. 2. Atherosclerosis.   Electronically Signed   By: Kerby Moors M.D.   On: 04/06/2014 17:28   Ct Head Wo Contrast  03/24/2014 IMPRESSION: 1. Small vessel ischemic disease and brain atrophy. 2. No acute intracranial abnormalities   Electronically Signed   By: Kerby Moors M.D.   On: 03/28/2014 17:21   Ct Angio Chest Pe W/cm &/or Wo Cm  03/20/2014 IMPRESSION: 1. No evidence of pulmonary embolus. 2. Trace bilateral pleural effusions noted. Bibasilar airspace opacification may reflect atelectasis or pneumonia. Interstitial prominence also seen; this could reflect minimal interstitial edema. 3. Underlying emphysema noted bilaterally. 4. Mild biatrial enlargement seen. 5. Diffuse coronary artery calcifications seen.   Electronically Signed   By: Garald Balding M.D.   On: 03/20/2014 03:37   MRI HEAD FINDINGS 03/20/14 There are large regions of acute infarction within the superior  cerebellum on both sides. Some  involvement of the inferior  cerebellum is present on the right. The region of infarction shows  swelling with mass effect upon the fourth ventricle. Ventricular  size is unchanged at this moment, but with further swelling there  could be obstructive hydrocephalus. No acute infarction in the  supratentorial brain. There are extensive chronic small vessel  changes throughout the white matter. No large vessel territory supra  tentorial infarction. No mass lesion. No extra-axial collection. No  pituitary mass. No inflammatory sinus disease. No skull or skullbase  lesion.  MRA HEAD FINDINGS  The right internal carotid artery is occluded to the level of the  skullbase. Left internal carotid artery shows atherosclerotic  irregularity in the carotid siphon region but no stenosis. The left  anterior and middle cerebral vessels show mild atherosclerotic  irregularity but are widely patent. Patent anterior communicating  artery give supply to the anterior circulation on the right, and  there is fairly normal visualization of the right anterior and  middle cerebral artery. Also contributing is a patent posterior  communicating artery on this side.  Both vertebral arteries are patent with the left being diminutive in  the right being dominant. No basilar  stenosis. Left PICA is patent.  Right PICA not demonstrated. Anterior inferior cerebellar arteries  are present bilaterally. There is some flow in the superior  cerebellar arteries bilaterally. Both posterior cerebral arteries  show flow and good distal opacification.  IMPRESSION:  Extensive infarction throughout the cerebellum, mostly in the region  of the superior cerebellum but with some involvement inferiorly on  the right. Swelling with mass effect upon the fourth ventricle but  no ventricular obstruction at this moment. No sign of hemorrhage.  Extensive chronic small vessel disease throughout the cerebral  hemispheric white matter.  Chronic right ICA occlusion. Left ICA sufficiently patent with  atherosclerotic irregularity in the siphon region. Good flow  demonstrated in both the anterior and middle cerebral artery  territories because of patent anterior and posterior communicating  arteries.  Both vertebral arteries are patent. All major posterior circulation  branches are demonstrated presently with the exception of right  PICA. This includes flow within both superior cerebellar arteries at  this time.   PORTABLE CHEST - 1 VIEW  COMPARISON: 03/23/2014  FINDINGS:  Cardiac enlargement. There is increased patchy airspace disease in  the right lung with small but increasing bilateral pleural  effusions. Calcification of the aorta. No pneumothorax.  IMPRESSION:  Increasing perihilar infiltration on the right in the increasing  bilateral pleural effusions.   Leone Haven, MD 03/24/2014, 8:10 AM PGY-2, [place] Intern pager: [phone], text pages welcome

## 2014-03-24 NOTE — Progress Notes (Signed)
I have seen and examined this patient. I have discussed with Dr Caryl Bis.  I agree with their findings and plans as documented in their progress note.  Acute Issue 1.  Pneumonia, possibly aspiration - (+) Urine Pneumococcal Antigen - Increase oxygen requirement  2.  Possible UTI, upper tract infection - (+) LE and (+) Nitrite on admission urinalysis. 3. Afib -rate controlled - Cardiology consulting - Amiodarone, IV dilt, and Metoprolol - Apixaban 4. Hypernatremia - Secondary to furosemide and poor per oral intake 5.  Cerebellar Stroke - F/U with Dr Leonie Man (neuro) as outpatient.  6.  Abdominal Distention with decreased Bowel sounds - R/O Ileus Vs SBO 7. Disposition - (+) DNR -  Plan - Once recovers from acute febrile illness, will see if still a CIR candidate.  - continue Vanc/Zosyn given uncertainty of role of UTI in febrile illness. - Once legionella urine test negative, stop Levaquin.  - Prednisone 40 mg daily - Duonebs scheduled  - Replete free water with IV fluids.  Hold furosemide.

## 2014-03-24 NOTE — Progress Notes (Signed)
Stroke Team Progress Note  HISTORY Stacy Pittman is a 81 y.o. female with a history of melanoma on the face in 2003 as well as atrial fibrillation on Coumadin with a subtherapeutic INR. Per the referring physician, the patient was in the kitchen and became lightheaded. The patient states that she remembers becoming lightheaded, but then feels that she passed out for a period of time. She does remember EMS arriving at her house, and feels that she was confused. She continues to be very soft spoken and sleepy.  She initially was reported to have left hemiparesis, but this has rapidly improved.She has a mildly disconjugate gaze which the granddaughter reports is new. She presented to eh ED 03/22/2014 at 1636. She was LKW was unclear. Patient was not administered TPA secondary to seizure onset,stroke was not initially suspected, rapidly improving symptoms. She was admitted for further evaluation and treatment.  SUBJECTIVE No family is at the bedside.  Overall she feels her condition is stable. Complains of frontal headache, some dizziness and queasiness.  OBJECTIVE Most recent Vital Signs: Filed Vitals:   03/24/14 0600 03/24/14 0800 03/24/14 0857 03/24/14 0900  BP: 153/88 115/40    Pulse:      Temp:    100.4 F (38 C)  TempSrc:    Axillary  Resp: 26   26  Height:      Weight:      SpO2:   93%    CBG (last 3)   Recent Labs  03/23/14 1251 03/23/14 1650 03/23/14 2141  GLUCAP 136* 121* 155*    IV Fluid Intake:   . sodium chloride 10 mL (03/23/14 2300)  . diltiazem (CARDIZEM) infusion 10 mg/hr (03/24/14 0514)    MEDICATIONS  . amiodarone  200 mg Oral BID  . apixaban  5 mg Oral BID  . atorvastatin  40 mg Oral Daily  . Chlorhexidine Gluconate Cloth  6 each Topical Q0600  . ipratropium-albuterol  3 mL Nebulization Q4H  . levofloxacin  750 mg Oral Q48H  . LORazepam  0.5 mg Oral QHS  . metoprolol tartrate  25 mg Oral BID  . mometasone-formoterol  2 puff Inhalation BID  . mupirocin  ointment  1 application Nasal BID  . piperacillin-tazobactam (ZOSYN)  IV  3.375 g Intravenous Q8H  . predniSONE  40 mg Oral Q breakfast  . senna  1 tablet Oral Daily  . sodium chloride  500 mL Intravenous Once  . vancomycin  500 mg Intravenous Q12H   PRN:    Diet:  Dysphagia 3 thin liquids Activity:   Bathroom privileges with assistance DVT Prophylaxis:  warfarin  CLINICALLY SIGNIFICANT STUDIES Basic Metabolic Panel:   Recent Labs Lab 03/23/14 0500 03/24/14 0324  NA 144 150*  K 3.1* 4.0  CL 112 114*  CO2 16* 21  GLUCOSE 162* 144*  BUN 14 16  CREATININE 0.77 1.04  CALCIUM 7.9* 8.5  MG  --  1.6   Liver Function Tests:   Recent Labs Lab 03/28/2014 1651  AST 40*  ALT 24  ALKPHOS 80  BILITOT 0.5  PROT 6.9  ALBUMIN 3.2*   CBC:   Recent Labs Lab 03/23/14 0500 03/24/14 0324  WBC 20.1* 24.4*  HGB 9.1* 9.3*  HCT 30.8* 30.2*  MCV 73.3* 71.9*  PLT 109* 139*   Coagulation:   Recent Labs Lab 03/13/2014 1651 03/20/14 0422 03/21/14 0700  LABPROT 16.4* 17.2* 16.9*  INR 1.36 1.44 1.41   Cardiac Enzymes:   Recent Labs Lab 04/09/2014 2048 03/20/14 0422  03/20/14 0800  TROPONINI <0.30 <0.30 <0.30   Urinalysis:   Recent Labs Lab 03/21/14 0952  COLORURINE YELLOW  LABSPEC 1.027  PHURINE 5.5  GLUCOSEU NEGATIVE  HGBUR NEGATIVE  BILIRUBINUR NEGATIVE  KETONESUR NEGATIVE  PROTEINUR NEGATIVE  UROBILINOGEN 0.2  NITRITE POSITIVE*  LEUKOCYTESUR LARGE*   Lipid Panel    Component Value Date/Time   CHOL 94 03/20/2014 0422   TRIG 66 03/20/2014 0422   HDL 34* 03/20/2014 0422   CHOLHDL 2.8 03/20/2014 0422   VLDL 13 03/20/2014 0422   LDLCALC 47 03/20/2014 0422   HgbA1C  Lab Results  Component Value Date   HGBA1C 6.2* 03/22/2014    Urine Drug Screen:      Component Value Date/Time   LABOPIA NONE DETECTED 03/21/2014 0952    Alcohol Level:   Recent Labs Lab 03/24/2014 2048  ETH <11    CT of the brain  03/18/2014    1. Small vessel ischemic disease and brain atrophy. 2.  No acute intracranial abnormalities     MRI of the brain  03/20/2014   Extensive infarction throughout the cerebellum, mostly in the region of the superior cerebellum but with some involvement inferiorly on the right. Swelling with mass effect upon the fourth ventricle but no ventricular obstruction at this moment. No sign of hemorrhage.  Extensive chronic small vessel disease throughout the cerebral hemispheric white matter  MRA of the brain  03/20/2014   Chronic right ICA occlusion. Left ICA sufficiently patent with atherosclerotic irregularity in the siphon region. Good flow demonstrated in both the anterior and middle cerebral artery territories because of patent anterior and posterior communicating arteries.  Both vertebral arteries are patent. All major posterior circulation branches are demonstrated presently with the exception of right PICA. This includes flow within both superior cerebellar arteries at this time.     2D Echocardiogram  Endocardial segments are poorly visualized  but there is a suggestion of akinesis of the apex and distal septal wall. The Study is inadequate to rule out apical thrombus by this study. EF 45-50%. The right ventricular systolic pressure was increased consistent with moderate pulmonary hypertension.  Carotid Doppler   Preliminary report: Right ICA is occluded. There is 60-79% left ICA stenosis, highest end of scale. Severe calcific plaque noted origin and proximal left ICA. Vertebral artery flow is antegrade.  CXR  03/18/2014   1. Cardiac enlargement and chronic interstitial coarsening. 2. Atherosclerosis.     CT Angio Chest  03/20/2014   1. No evidence of pulmonary embolus. 2. Trace bilateral pleural effusions noted. Bibasilar airspace opacification may reflect atelectasis or pneumonia. Interstitial prominence also seen; this could reflect minimal interstitial edema. 3. Underlying emphysema noted bilaterally. 4. Mild biatrial enlargement seen. 5. Diffuse coronary artery  calcifications seen.     EEG  This is a normal EEG. No epileptiform activity is noted.   EKG  Ectopic atrial tachycardia. For complete results please see formal report.   Therapy Recommendations SNF  Physical Exam  General: The patient is alert and cooperative at the time of the examination.  Skin: No significant peripheral edema is noted.   Neurologic Exam  Mental status: The patient is oriented x 3.  Cranial nerves: Facial symmetry is present. Speech is normal, no aphasia or dysarthria is noted. Extraocular movements are restricted, the patient will not initiate lateral gaze to side. Visual fields are difficult to assess, the patient is not blink to threat from either side.  Motor: The patient has good strength in all  4 extremities.  Sensory examination: Soft touch sensation is symmetric on the face, arms, and legs.  Coordination: The patient has good finger-nose-finger and heel-to-shin on the left, the patient has dysmetria with the right arm and leg.  Gait and station: The gait was not tested.  Reflexes: Deep tendon reflexes are symmetric.   ASSESSMENT Ms. Stacy Pittman is a 80 y.o. female presenting with syncope, loss of consciousness as well as left hemiparesis and dysconjugate gaze. 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 Infarcts felt to be embolic secondary to known atrial fibrillation.  On warfarin prior to admission with INR subtherapeutic at 1.36 on arrival. Now on Eliquis for secondary stroke prevention.   atrial fibrillation, on coumadin prior to admission hypertension Hyperlipidemia, LDL 47, on lipitor 10 mg PTA, now on lipitor 40 mg, at goal LDL < 100 CHF COPD, home O2 Right carotid occlusion, left carotid stenosis  Hospital day # 5  The patient is developing fevers, and a high white blood count, with evidence of a right perihilar infiltrate and pleural effusions. Patient may be at risk for aspiration  pneumonia. The patient is on dysphagia 3 neck or thick liquid diet. Stroke workup is complete. Plans are for CIR.  Carotid Doppler study shows occlusion of the right internal carotid artery, stenosis of the left internal carotid artery, likely greater than 70%. This stenosis is asymptomatic at this time, but upon recovery from the cerebellar stroke, considerations for a left carotid enterectomy should be made.  TREATMENT/PLAN  Continue eliquis for stroke prevention   Await rehab eval and disposition, possible rehabilitation transfer Monday  Stroke team will sign off, the patient may followup with Dr. Leonie Man in 4-6 weeks following discharge. Considerations at that time for possible vascular surgery referral for left carotid endarterectomy can be made at that time.   Kathrynn Ducking  03/24/2014 10:17 AM  To contact Stroke Continuity provider, please refer to [email or web address] After hours, contact General Neurology

## 2014-03-24 NOTE — Progress Notes (Signed)
Notified Md about pts medications on Amiodarone gtt and sch po.  Awaiting call back.  Vs stable will continue to monitor. Wynona Canes

## 2014-03-24 NOTE — Progress Notes (Signed)
Patient Name: Stacy Pittman      SUBJECTIVE:  Patient admitted with a fall and jerking movements and subsequently found to have extensive infarction of her cerebellum and presumptively an acute stroke occurring in the context of atrial fibrillation. Chronic Coumadin was in place; however, the INR was subtherapeutic.  Her recent course has been complicated by atrial fibrillation with a rapid rate. Amiodarone therapy was reinitiated. Notably, she is also on apixaban.   Overnight she developed increasing respiratory distress prompting the use of a nonrebreather  Pneumococcal antigen was found in her urine. She has been febrile. She remains on antibiotics. She is a DO NOT RESUSCITATE.  Past Medical History  Diagnosis Date  . Atrial fibrillation   . COPD (chronic obstructive pulmonary disease)   . CHF (congestive heart failure)   . Melanoma of face     Surgical resection  . Hypertension   . Shortness of breath   . Stroke     Cerebellar 4/15    Scheduled Meds:  Scheduled Meds: . amiodarone  200 mg Oral BID  . apixaban  5 mg Oral BID  . atorvastatin  40 mg Oral Daily  . Chlorhexidine Gluconate Cloth  6 each Topical Q0600  . ipratropium-albuterol  3 mL Nebulization Q4H  . levofloxacin  750 mg Oral Q48H  . LORazepam  0.5 mg Oral QHS  . metoprolol tartrate  25 mg Oral BID  . mometasone-formoterol  2 puff Inhalation BID  . mupirocin ointment  1 application Nasal BID  . piperacillin-tazobactam (ZOSYN)  IV  3.375 g Intravenous Q8H  . predniSONE  40 mg Oral Q breakfast  . senna  1 tablet Oral Daily  . sodium chloride  500 mL Intravenous Once  . vancomycin  500 mg Intravenous Q12H   Continuous Infusions: . dextrose    . diltiazem (CARDIZEM) infusion 10 mg/hr (03/24/14 0514)    PHYSICAL EXAM Filed Vitals:   03/24/14 0857 03/24/14 0900 03/24/14 1024 03/24/14 1150  BP:   140/53 122/44  Pulse:   115   Temp:  100.4 F (38 C)  98.7 F (37.1 C)  TempSrc:  Axillary  Oral   Resp:  26 33   Height:      Weight:      SpO2: 93%  96% 94%   Temp (24hrs), Avg:100.2 F (37.9 C), Min:98.7 F (37.1 C), Max:102.6 F (39.2 C)   General appearance: alert, cachectic and severe distress Lungs: marked decrease sounds Heart: irregularly irregular rhythm and rapid  Extremities: edema none Skin: Skin color, texture, turgor normal. No rashes or lesions Neurologic: alert and responsie  TELEMETRY: Reviewed telemetry pt Afib with rates 110    Intake/Output Summary (Last 24 hours) at 03/24/14 1207 Last data filed at 03/24/14 0700  Gross per 24 hour  Intake 1177.5 ml  Output    230 ml  Net  947.5 ml    LABS: Basic Metabolic Panel:  Recent Labs Lab 03/17/2014 1651 03/20/14 0422 03/21/14 0700 03/22/14 0557 03/23/14 0500 03/24/14 0324  NA 140 139 143 144 144 150*  K 4.3 5.3 4.4 3.8 3.1* 4.0  CL 102 104 107 108 112 114*  CO2 22 19 20 19  16* 21  GLUCOSE 116* 126* 139* 134* 162* 144*  BUN 25* 26* 20 18 14 16   CREATININE 1.16* 1.09 0.85 0.89 0.77 1.04  CALCIUM 8.9 8.2* 8.6 8.7 7.9* 8.5  MG  --   --   --   --   --  1.6   Cardiac Enzymes: No results found for this basename: CKTOTAL, CKMB, CKMBINDEX, TROPONINI,  in the last 72 hours CBC:  Recent Labs Lab 03/18/2014 1651 03/20/14 0422 03/21/14 0700 03/22/14 0557 03/23/14 0500 03/24/14 0324  WBC 14.4* 12.9* 14.4* 16.8* 20.1* 24.4*  HGB 11.3* 10.5* 10.0* 9.9* 9.1* 9.3*  HCT 37.3 34.4* 33.0* 32.9* 30.8* 30.2*  MCV 73.1* 72.6* 73.0* 73.3* 73.3* 71.9*  PLT 159 127* 135* 126* 109* 139*   BNP: BNP (last 3 results)  Recent Labs  04/07/2014 1651  PROBNP 789.5*      ASSESSMENT AND PLAN:  Active Problems:   Embolic stroke involving right cerebellar artery   Embolic stroke involving cerebellar artery   Atrial fibrillation   Acute respiratory failure   Pneumonia, community acquired res Rapid afib is not likely helping but inlight of the CXR findings of pneumonia is not the probable culprit, and rapid  rates in part physiologic and ability to sustain NSR if we were to undertake cardioversion   I would use amio though to help maintain sinus if she converts and also help to slow her down    Signed, Deboraha Sprang MD  03/24/2014

## 2014-03-24 NOTE — Progress Notes (Signed)
Md made aware of MRSA positive. Placed pt on contact. VS stable  94% on NRB.  Assessed for new Iv's unable to obtain.  Will get another RN to assess.  Will continue to monitor. Wynona Canes

## 2014-03-24 NOTE — Progress Notes (Signed)
I have seen and examined this patient. I have discussed with Dr Berkley Harvey.  I agree with their findings and plans as documented in their progress note.

## 2014-03-25 ENCOUNTER — Inpatient Hospital Stay (HOSPITAL_COMMUNITY): Payer: Medicare HMO

## 2014-03-25 DIAGNOSIS — J189 Pneumonia, unspecified organism: Secondary | ICD-10-CM

## 2014-03-25 DIAGNOSIS — J690 Pneumonitis due to inhalation of food and vomit: Secondary | ICD-10-CM

## 2014-03-25 DIAGNOSIS — I6340 Cerebral infarction due to embolism of unspecified cerebral artery: Principal | ICD-10-CM

## 2014-03-25 LAB — BASIC METABOLIC PANEL
BUN: 21 mg/dL (ref 6–23)
BUN: 22 mg/dL (ref 6–23)
CHLORIDE: 108 meq/L (ref 96–112)
CO2: 20 mEq/L (ref 19–32)
CO2: 20 meq/L (ref 19–32)
CREATININE: 1.24 mg/dL — AB (ref 0.50–1.10)
Calcium: 8.3 mg/dL — ABNORMAL LOW (ref 8.4–10.5)
Calcium: 8.3 mg/dL — ABNORMAL LOW (ref 8.4–10.5)
Chloride: 110 mEq/L (ref 96–112)
Creatinine, Ser: 1.2 mg/dL — ABNORMAL HIGH (ref 0.50–1.10)
GFR calc Af Amer: 48 mL/min — ABNORMAL LOW (ref >90)
GFR calc non Af Amer: 40 mL/min — ABNORMAL LOW (ref >90)
GFR calc non Af Amer: 42 mL/min — ABNORMAL LOW (ref >90)
GFR, EST AFRICAN AMERICAN: 46 mL/min — AB (ref >90)
GLUCOSE: 186 mg/dL — AB (ref 70–99)
Glucose, Bld: 221 mg/dL — ABNORMAL HIGH (ref 70–99)
POTASSIUM: 3.4 meq/L — AB (ref 3.7–5.3)
Potassium: 3.4 mEq/L — ABNORMAL LOW (ref 3.7–5.3)
SODIUM: 145 meq/L (ref 137–147)
Sodium: 148 mEq/L — ABNORMAL HIGH (ref 137–147)

## 2014-03-25 LAB — GLUCOSE, CAPILLARY
GLUCOSE-CAPILLARY: 141 mg/dL — AB (ref 70–99)
Glucose-Capillary: 190 mg/dL — ABNORMAL HIGH (ref 70–99)
Glucose-Capillary: 193 mg/dL — ABNORMAL HIGH (ref 70–99)
Glucose-Capillary: 182 mg/dL — ABNORMAL HIGH (ref 70–99)

## 2014-03-25 LAB — URINALYSIS, ROUTINE W REFLEX MICROSCOPIC
BILIRUBIN URINE: NEGATIVE
GLUCOSE, UA: NEGATIVE mg/dL
KETONES UR: 15 mg/dL — AB
Nitrite: NEGATIVE
Protein, ur: 30 mg/dL — AB
Specific Gravity, Urine: 1.028 (ref 1.005–1.030)
Urobilinogen, UA: 1 mg/dL (ref 0.0–1.0)
pH: 5 (ref 5.0–8.0)

## 2014-03-25 LAB — URINE MICROSCOPIC-ADD ON

## 2014-03-25 LAB — URINE CULTURE: Colony Count: >=100000

## 2014-03-25 LAB — CBC
HCT: 30.1 % — ABNORMAL LOW (ref 36.0–46.0)
Hemoglobin: 9.3 g/dL — ABNORMAL LOW (ref 12.0–15.0)
MCH: 22.4 pg — ABNORMAL LOW (ref 26.0–34.0)
MCHC: 30.9 g/dL (ref 30.0–36.0)
MCV: 72.4 fL — AB (ref 78.0–100.0)
PLATELETS: 141 10*3/uL — AB (ref 150–400)
RBC: 4.16 MIL/uL (ref 3.87–5.11)
RDW: 23 % — AB (ref 11.5–15.5)
WBC: 27 10*3/uL — AB (ref 4.0–10.5)

## 2014-03-25 LAB — CREATININE, URINE, RANDOM: Creatinine, Urine: 108.94 mg/dL

## 2014-03-25 MED ORDER — POTASSIUM CHLORIDE 10 MEQ/100ML IV SOLN: 10.0000 meq | INTRAVENOUS | Status: DC

## 2014-03-25 MED ORDER — FUROSEMIDE 10 MG/ML IJ SOLN
40.0000 mg | Freq: Once | INTRAMUSCULAR | Status: AC
  Administered 2014-03-25: 40 mg via INTRAVENOUS
  Filled 2014-03-25: qty 4

## 2014-03-25 MED ORDER — INSULIN ASPART 100 UNIT/ML ~~LOC~~ SOLN
0.0000 [IU] | Freq: Three times a day (TID) | SUBCUTANEOUS | Status: DC
  Administered 2014-03-25: 1 [IU] via SUBCUTANEOUS
  Administered 2014-03-25: 2 [IU] via SUBCUTANEOUS
  Administered 2014-03-25: 3 [IU] via SUBCUTANEOUS

## 2014-03-25 MED ORDER — MORPHINE SULFATE 2 MG/ML IJ SOLN
0.5000 mg | INTRAMUSCULAR | Status: DC | PRN
  Administered 2014-03-26: 0.5 mg via INTRAVENOUS
  Filled 2014-03-25: qty 1

## 2014-03-25 MED ORDER — MORPHINE SULFATE 2 MG/ML IJ SOLN
1.0000 mg | Freq: Once | INTRAMUSCULAR | Status: AC
  Administered 2014-03-25: 1 mg via INTRAVENOUS
  Filled 2014-03-25: qty 1

## 2014-03-25 MED ORDER — KCL IN DEXTROSE-NACL 30-5-0.45 MEQ/L-%-% IV SOLN
INTRAVENOUS | Status: DC
  Administered 2014-03-25 – 2014-03-26 (×2): via INTRAVENOUS
  Filled 2014-03-25 (×3): qty 1000

## 2014-03-25 MED ORDER — PREDNISOLONE 15 MG/5ML PO SOLN
60.0000 mg | Freq: Every day | ORAL | Status: DC
  Administered 2014-03-25: 60 mg via ORAL
  Filled 2014-03-25 (×3): qty 20

## 2014-03-25 MED ORDER — POTASSIUM CHLORIDE 20 MEQ/15ML (10%) PO LIQD
40.0000 meq | Freq: Once | ORAL | Status: AC
  Administered 2014-03-25: 40 meq via ORAL
  Filled 2014-03-25: qty 30

## 2014-03-25 MED ORDER — POTASSIUM CHLORIDE 20 MEQ/15ML (10%) PO LIQD
20.0000 meq | ORAL | Status: AC
  Administered 2014-03-25 (×2): 20 meq via ORAL
  Filled 2014-03-25 (×2): qty 15

## 2014-03-25 MED ORDER — METOPROLOL TARTRATE 1 MG/ML IV SOLN
2.5000 mg | INTRAVENOUS | Status: DC
Start: 2014-03-25 — End: 2014-03-26
  Administered 2014-03-25 – 2014-03-26 (×5): 2.5 mg via INTRAVENOUS
  Filled 2014-03-25 (×10): qty 5

## 2014-03-25 NOTE — Progress Notes (Signed)
SLP Cancellation Note  Patient Details Name: Stacy Pittman MRN: [id number] DOB: [birth year]   Cancelled treatment:       Reason Eval/Treat Not Completed: Medical issues which prohibited therapy. Pt currently on BiPAP. Per RN has barely eaten this weekend due to use of BiPAP. SLP last week downgraded pt to nectar thick liquids due to concerns for aspiration; had tentatively planned to do MBS today to determine severity of dysphagia. Pt is not able to participate until she can stay off BiPAP. Will follow via chart today, MD please give guidance for plan. If MBS is desired, please order.    Katherene Ponto Shaynah Hund 03/25/2014, 8:24 AM

## 2014-03-25 NOTE — Progress Notes (Addendum)
PGY-2 Update Note  See recent interim note; little change in patient status, remains on BiPAP. Spoke with son (pt's primary caregiver) in room, who reiterated NO INTUBATION. Discussed that pt is on maximal therapy medically and with BiPAP for her respiratory distress, and that she intermittently is having desaturations and doing poorly even on BiPAP. Pt's son stated "I'm not sure how much fight mom has left in her," and also stated that he has been expecting a decline since she had to be moved to stepdown and put on BiPAP.  Discussed various options with son, including remaining on BiPAP, coming off BiPAP, using morphine for air hunger / comfort, etc. Son stated that he felt comfortable leaving her on BiPAP and seeing how she does, tonight. He stated again that he understands his mother may not survive this illness and thanked me for our efforts and asked to be kept updated, which I assured him we would.  Spoke briefly with Dr. Joya Gaskins through [facility], who agreed with continuing BiPAP and using morphine for comfort and strongly recommended palliative care consult, which I have placed, and I spoke to Dr. Hilma Favors who will try to see pt first thing tomorrow. Dr. Hilma Favors recommended a PRN morphine order (which I will place) for air hunger / comfort, which hopefully will maximize benefit from BiPAP, as well.  Overall prognosis seems poor, but will continue with current measures and monitor closely. FPTS greatly appreciates assistance from PCCM and palliative care.  Son: Broadus John - Cell [phone] Daughter: Costella Hatcher [phone]   Emmaline Kluver, MD PGY-2, [place] [facility] 03/25/2014, 5:10 PM [place] Service pager: [phone] (text pages welcome through [facility])

## 2014-03-25 NOTE — Progress Notes (Signed)
PGY-2 Interim Note  S: Pt remains on BiPAP, denies feeling like her breathing is worse. Denies chest pain. Per RN, pt was off BiPAP "for about 10 minutes" earlier this morning, with sats in the 70's even on non-rebreather. RN also feels pt's color is paler than previously.  O:  BP 112/42  Pulse 110  Temp(Src) 98.1 F (36.7 C) (Axillary)  Resp 25  Ht 5\' 7"  (1.702 m)  Wt 151 lb 10.8 oz (68.8 kg)  BMI 23.75 kg/m2  SpO2 90% Gen: awake / alert, no obvious distress on BiPAP; complexion slightly more pale than previously Cardio: irregularly irregular, tachycardic, no murmur appreciated Pulm: decreased BS bibasilar fields, but generally clearer on BiPAP than heard previous days  WOB appears increased even on BiPAP Ext: cool but with palpable distal pulses, good cap refill  A/P:  - repeat CXR - consider Lasix 40 mg IV - otherwise per previous notes; see Dr. Evette Georges note (cardiology) for adjustment to meds for [place], MD PGY-2, [place] [facility] 03/25/2014, 2:18 PM [place] Service pager: [phone] (text pages welcome through [facility])

## 2014-03-25 NOTE — Progress Notes (Signed)
Family Medicine Teaching Service Daily Progress Note Intern Pager: [phone]  Patient name: Stacy Pittman Medical record number: [id number] Date of birth: 11/08/1932 Age: 80 y.o. Gender: female  Primary Care Provider: Margarita Rana, MD Consultants: neuro, cards Code Status: DNR  Pt Overview and Major Events to Date:  4/7: LOC w/ seizure-like activity and left side weakness 4/8: Extensive infarction throughout the cerebellum 4/9: CIR recommended; awaiting evaluation 4/10: Afib w/ RVR - dilt drip, restarted amnio PO, CXR Inc perihilar infiltration RUL 4/13: Strep Pneumo + PNA, Ecoli UTI;   Assessment and Plan: Stacy Pittman is a 80 y.o. female presenting with loss of consciousness and TIA symptoms . PMH is significant for HTN, COPD, HLD, afib, and CHF.   # CVA w/ seizure-like activity - EEG: normal, no epileptiform  - MRI/MRA   Extensive infarction throughout the cerebellum, mostly in the region of the superior cerebellum but with some involvement inferiorly on the right.   Swelling with mass effect upon the fourth ventricle but no ventricular obstruction at this moment. No sign of hemorrhage.   Extensive chronic small vessel disease throughout the cerebral hemispheric white matter.   Chronic right ICA occlusion. Left ICA sufficiently patent. Both vertebral arteries are patent.  -neuro consulted f/u their recs   Switched to Eliquis 4/9  Neuro signed off: followup with Dr. Leonie Man in 4-6 weeks following discharge - ECHO  akinesis of the apex and distal septal wall. The Study is inadequate to rule out apical thrombus by this study. Recommend Cardiac MRI to further delineate for LV thrombus  EF 93-26%, Diastolic dysfcn grade 3  Pulmonary HTN: PA pressure = 57 -SLP: Dysphagia 3 (mechanical soft, thin liquid) - Lipitor inc to 40 mg qhs - Ativan 0.5 qhs; attempting to taper off benzo, but did have seizure-like activity on admission most likely due to Stroke  - On Xanax 0.5mg  qhs at  home but reports taking two nightly; Held last night w/ inc SOB -PT recs: SNF; OT recs CIR  Awaiting CIR Insurance approval  # ID: UTI & PNA( Strep Pneumo +); Abd soft and non-tender.  - Fevers trending down: Last 100.4 on 4/12 @ 9am - WBC: Trending up: 27 (4/13) > 24.4 (4/12) > 20.1 (4/11) > 16.8 (4/10) > 14.4 (4/9) > 12.9 (4/8) - UA: Many bacteria, Nit and Leu +; Urine culture (4/9) : Ecoli - awaiting sensitivities - CXR 4/11: Worsening right upper lobe pneumonia; Urine Strep Antigens + - Abx: Vanc/Zosyn (4/11>>) to cover PNA and UTI given increasing WBC and continued fevers on Levaquin (4/9 >>4/13)  # COPD: on home O2 2L [place]. Patient uses Advair and albuterol at home - O2 requirement: On BiPAP;  Likely due to PNA vs possible COPD exacerbation - Dulera BID - scheduled duonebs q6hrs  - monitor respiratory status, and continue Abx as above - Prednisolone 60mg  IV x 5 days (4/13>>) for possibe COPD exacerbation  # Afib: rate controlled on admission - Switched to Eliquis - home amiodarone got discontinued, and restarted 4/10 - Cardiology consulted:  Amiodarone 200 mg PO & Amiodarone IV  Diltiazem drip  Metoprolol 25 mg BID - Rate currently 110s-120s  # Anemia: Hgb 11.3 on admission. No prior values noted. This is microcytic. No mention of bleeding on interview.  - Hgb: stable 9.3 (4/13) = 9.3 (4/12) < 9.1 (4/11) < 9.9 (4/10) < 10 (4/9) 11.3 on 4/7 - monitor for bleeding  - Will start PO iron on discharge  # AKI on CKD:  - Cr Increasing:  1.24 (4/13) > 0.89 (4/10) < 1.16 with GFR 43. No mention of kidney disease on discussion with family.  - continue to monitor Cr  # Hyperglycemia - CBGs 200s - Start SSI sensitive AC due to elevated CBGs and starting steroids  # Hypokalemia  - Monitoring and replacing as needed - Oral potassium solution  # Hypernatremia: Resolved  # Abdominal distension:  - KUB 4/12: No gas-filled loop of dilated bowel identified to suggest  obstruction.  FEN/GI: nectar thick liquids due to concerns for aspiration; D5 1/2 NS +30K @ 50 cc/hr Prophylaxis: On Eliquis  Disposition: pending improvement in respiratory status  Subjective:  Says her breathing is "not good", but denies CP or abdominal pain. Per nursing not eating well.   Objective: Temp:  [98.5 F (36.9 C)-100.4 F (38 C)] 98.5 F (36.9 C) (04/13 0313) Pulse Rate:  [106-122] 110 (04/13 0325) Resp:  [25-33] 28 (04/13 0325) BP: (112-143)/(40-63) 112/63 mmHg (04/13 0325) SpO2:  [84 %-96 %] 88 % (04/13 0325) FiO2 (%):  [60 %-80 %] 80 % (04/13 0325) Weight:  [151 lb 10.8 oz (68.8 kg)] 151 lb 10.8 oz (68.8 kg) (04/13 0313)  Physical Exam: General: Laying in bed w/ BiPAP in place Cardiovascular: Irreg, Irreg rhythm, tachycardia (110s), no murmurs Respiratory: On bipap, No rhonchi appreciated; decreased breath sounds in lower lung fields b/l Abdomen: soft, non-tender Extremities: Cool but pulses palpable, Trace edema  Laboratory:  Recent Labs Lab 03/23/14 0500 03/24/14 0324 03/25/14 0328  WBC 20.1* 24.4* 27.0*  HGB 9.1* 9.3* 9.3*  HCT 30.8* 30.2* 30.1*  PLT 109* 139* 141*    Recent Labs Lab 03/29/2014 1651  03/24/14 0324 03/24/14 2000 03/25/14 0328  NA 140  < > 150* 148* 145  K 4.3  < > 4.0 3.6* 3.4*  CL 102  < > 114* 111 108  CO2 22  < > 21 19 20   BUN 25*  < > 16 18 21   CREATININE 1.16*  < > 1.04 1.14* 1.24*  CALCIUM 8.9  < > 8.5 8.4 8.3*  PROT 6.9  --   --   --   --   BILITOT 0.5  --   --   --   --   ALKPHOS 80  --   --   --   --   ALT 24  --   --   --   --   AST 40*  --   --   --   --   GLUCOSE 116*  < > 144* 201* 221*  < > = values in this interval not displayed. Cardiac Panel (last 3 results) No results found for this basename: CKTOTAL, CKMB, TROPONINI, RELINDX,  in the last 72 hours D-Dimer: 0.70  Imaging/Diagnostic Tests:  Dg Chest 2 View  03/20/2014 IMPRESSION: 1. Cardiac enlargement and chronic interstitial coarsening. 2.  Atherosclerosis.   Electronically Signed   By: Kerby Moors M.D.   On: 03/29/2014 17:28   Ct Head Wo Contrast  03/28/2014 IMPRESSION: 1. Small vessel ischemic disease and brain atrophy. 2. No acute intracranial abnormalities   Electronically Signed   By: Kerby Moors M.D.   On: 03/25/2014 17:21   Ct Angio Chest Pe W/cm &/or Wo Cm  03/20/2014 IMPRESSION: 1. No evidence of pulmonary embolus. 2. Trace bilateral pleural effusions noted. Bibasilar airspace opacification may reflect atelectasis or pneumonia. Interstitial prominence also seen; this could reflect minimal interstitial edema. 3. Underlying emphysema noted bilaterally. 4. Mild biatrial enlargement seen. 5. Diffuse coronary  artery calcifications seen.   Electronically Signed   By: Garald Balding M.D.   On: 03/20/2014 03:37   MRI HEAD FINDINGS 03/20/14 There are large regions of acute infarction within the superior  cerebellum on both sides. Some involvement of the inferior  cerebellum is present on the right. The region of infarction shows  swelling with mass effect upon the fourth ventricle. Ventricular  size is unchanged at this moment, but with further swelling there  could be obstructive hydrocephalus. No acute infarction in the  supratentorial brain. There are extensive chronic small vessel  changes throughout the white matter. No large vessel territory supra  tentorial infarction. No mass lesion. No extra-axial collection. No  pituitary mass. No inflammatory sinus disease. No skull or skullbase  lesion.  MRA HEAD FINDINGS  The right internal carotid artery is occluded to the level of the  skullbase. Left internal carotid artery shows atherosclerotic  irregularity in the carotid siphon region but no stenosis. The left  anterior and middle cerebral vessels show mild atherosclerotic  irregularity but are widely patent. Patent anterior communicating  artery give supply to the anterior circulation on the right, and  there is fairly  normal visualization of the right anterior and  middle cerebral artery. Also contributing is a patent posterior  communicating artery on this side.  Both vertebral arteries are patent with the left being diminutive in  the right being dominant. No basilar stenosis. Left PICA is patent.  Right PICA not demonstrated. Anterior inferior cerebellar arteries  are present bilaterally. There is some flow in the superior  cerebellar arteries bilaterally. Both posterior cerebral arteries  show flow and good distal opacification.  IMPRESSION:  Extensive infarction throughout the cerebellum, mostly in the region  of the superior cerebellum but with some involvement inferiorly on  the right. Swelling with mass effect upon the fourth ventricle but  no ventricular obstruction at this moment. No sign of hemorrhage.  Extensive chronic small vessel disease throughout the cerebral  hemispheric white matter.  Chronic right ICA occlusion. Left ICA sufficiently patent with  atherosclerotic irregularity in the siphon region. Good flow  demonstrated in both the anterior and middle cerebral artery  territories because of patent anterior and posterior communicating  arteries.  Both vertebral arteries are patent. All major posterior circulation  branches are demonstrated presently with the exception of right  PICA. This includes flow within both superior cerebellar arteries at  this time.   PORTABLE CHEST - 1 VIEW  COMPARISON: 03/25/2014  FINDINGS:  Cardiac enlargement. There is increased patchy airspace disease in  the right lung with small but increasing bilateral pleural  effusions. Calcification of the aorta. No pneumothorax.  IMPRESSION:  Increasing perihilar infiltration on the right in the increasing  bilateral pleural effusions.   Phill Myron, MD 03/25/2014, 6:36 AM PGY-1, [place] Intern pager: [phone], text pages welcome

## 2014-03-25 NOTE — Progress Notes (Signed)
OT Cancellation Note  Patient Details Name: Stacy Pittman MRN: [id number] DOB: November 23, 1932   Cancelled Treatment:    Reason Eval/Treat Not Completed: Medical issues which prohibited therapy  (pt with sats 79% on NRB with RN awaiting transfer back to Bipap and requested hold therapy today stating pt with decreased mobility participation as well)   Almon Register [phone] 03/25/2014, 12:17 PM

## 2014-03-25 NOTE — Progress Notes (Signed)
Subjective:  On BiPAP with improved breathing  Objective:   Vital Signs in the last 24 hours: Temp:  [98.1 F (36.7 C)-99.8 F (37.7 C)] 98.1 F (36.7 C) (04/13 1140) Pulse Rate:  [106-122] 110 (04/13 0325) Resp:  [25-30] 25 (04/13 0800) BP: (94-143)/(42-63) 112/42 mmHg (04/13 1140) SpO2:  [84 %-93 %] 90 % (04/13 1312) FiO2 (%):  [60 %-80 %] 80 % (04/13 1312) Weight:  [151 lb 10.8 oz (68.8 kg)] 151 lb 10.8 oz (68.8 kg) (04/13 0313)  Intake/Output from previous day: 04/12 0701 - 04/13 0700 In: 2178.5 [I.V.:1828.5; IV Piggyback:350] Out: 625 [Urine:625]  Medications: . amiodarone  200 mg Oral BID  . apixaban  5 mg Oral BID  . atorvastatin  40 mg Oral Daily  . Chlorhexidine Gluconate Cloth  6 each Topical Q0600  . insulin aspart  0-9 Units Subcutaneous TID WC  . ipratropium-albuterol  3 mL Nebulization Q4H  . LORazepam  0.5 mg Oral QHS  . metoprolol tartrate  25 mg Oral BID  . mometasone-formoterol  2 puff Inhalation BID  . mupirocin ointment  1 application Nasal BID  . piperacillin-tazobactam (ZOSYN)  IV  3.375 g Intravenous Q8H  . prednisoLONE  60 mg Oral QAC breakfast  . senna  1 tablet Oral Daily  . sodium chloride  500 mL Intravenous Once  . vancomycin  500 mg Intravenous Q12H    . amiodarone (NEXTERONE PREMIX) 360 mg/200 mL dextrose 30 mg/hr (03/25/14 0057)  . dexrose 5 % and 0.45 % NaCl with KCl 30 mEq/L 50 mL/hr at 03/25/14 0835  . diltiazem (CARDIZEM) infusion 10 mg/hr (03/24/14 2336)    Physical Exam:   General appearance: alert, cooperative and no distress Neck: no carotid bruit, no JVD, supple, symmetrical, trachea midline and thyroid not enlarged, symmetric, no tenderness/mass/nodules Lungs: decreased BS; no wheezing Heart: irregularly irregular rhythm Abdomen: soft, non-tender; bowel sounds normal; no masses,  no organomegaly Extremities: no edema, redness or tenderness in the calves or thighs Pulses: 2+ and symmetric Skin: Skin color, texture,  turgor normal. No rashes or lesions Neurologic: facial droop with hemiparesis improving   Rate:115  Rhythm: atrial fibrillation  Lab Results:    Recent Labs  03/25/14 0328 03/25/14 0820  NA 145 148*  K 3.4* 3.4*  CL 108 110  CO2 20 20  GLUCOSE 221* 186*  BUN 21 22  CREATININE 1.24* 1.20*   No results found for this basename: TROPONINI, CK, MB,  in the last 72 hours Hepatic Function Panel No results found for this basename: PROT, ALBUMIN, AST, ALT, ALKPHOS, BILITOT, BILIDIR, IBILI,  in the last 72 hours No results found for this basename: INR,  in the last 72 hours BNP (last 3 results)  Recent Labs  03/14/2014 1651  PROBNP 789.5*    Lipid Panel     Component Value Date/Time   CHOL 94 03/20/2014 0422   TRIG 66 03/20/2014 0422   HDL 34* 03/20/2014 0422   CHOLHDL 2.8 03/20/2014 0422   VLDL 13 03/20/2014 0422   LDLCALC 47 03/20/2014 0422      Imaging:  Dg Chest Port 1 View  03/24/2014   CLINICAL DATA:  Difficulty breathing.  EXAM: PORTABLE CHEST - 1 VIEW  COMPARISON:  March 22, 2014.  FINDINGS: Stable cardiomegaly. No pneumothorax is noted. Increased airspace opacity is noted in the right upper lobe consistent with worsening pneumonia. Stable right basilar opacity is noted most consistent with atelectasis with associated pleural effusion. Stable left basilar opacity concerning  for pneumonia or atelectasis with associated pleural effusion. Bony thorax is intact.  IMPRESSION: Stable left basilar opacity consistent with pneumonia or atelectasis with associated pleural effusion. Worsening right upper lobe pneumonia, with persistent right lower lobe atelectasis and pleural effusion.   Electronically Signed   By: Sabino Dick M.D.   On: 03/24/2014 08:56   Dg Abd Portable 1v  03/24/2014   CLINICAL DATA:  Abdominal distention, constipation  EXAM: PORTABLE ABDOMEN - 1 VIEW  COMPARISON:  None.  FINDINGS: No gas-filled dilated loop of bowel is identified. Vascular calcifications are noted.  Presence or absence of air-fluid levels or free air is suboptimally evaluated on this supine projection. Mild degradation of imaging due to mild motion artifact is present. No abnormal radiopacity allowing for motion and vascular calcifications. Costochondral calcifications are also reidentified.  IMPRESSION: No gas-filled loop of dilated bowel identified to suggest obstruction.   Electronically Signed   By: Conchita Paris M.D.   On: 03/24/2014 19:57      Assessment/Plan:   Active Problems:   Embolic stroke involving right cerebellar artery   Embolic stroke involving cerebellar artery   Atrial fibrillation   Acute respiratory failure   Pneumonia, community acquired  Still with AF, rates in 105-120 range on Amiodarone drip and 10 mg/hr of cardizem. Will dc oral amiodarone. Also dc lopressor orally and change to 2.5 mg Iv every 4 hrs. F/U ECG tomorrow. Predominantly superior cerebellar infarction with mild swelling with mass effect upon the fourth ventricle. Chronic right ICA occlusion.  Currently on eliquis for anticoagulation.    Troy Sine, MD, [facility] 03/25/2014, 1:46 PM

## 2014-03-25 NOTE — Progress Notes (Signed)
FMTS Attending Daily Note:  Annabell Sabal MD  [phone] pager  Family Practice pager:  [phone] I have seen and examined this patient and have reviewed their chart. I have discussed this patient with the resident. I agree with the resident's findings, assessment and care plan.  Additionally:  Still on Bipap.  Re-eval later this PM, if still requiring support repeat CXR.  Agree stopping Levaquin.  Hypernatremic secondary to fluid loss.      Alveda Reasons, MD 03/25/2014

## 2014-03-25 NOTE — Progress Notes (Signed)
Notified Md about pts po potassium order.   New orders received. Placed pt back on Bipap.  Vs stable will continue to monitor. Stacy Pittman

## 2014-03-25 NOTE — Progress Notes (Signed)
RT changed settings on bipap to help with oxygenation of pt.

## 2014-03-25 NOTE — Progress Notes (Signed)
Noted events over the weekend. I will follow her progress both medically as well as functionally. [phone]

## 2014-03-25 NOTE — Progress Notes (Signed)
PT Cancellation Note  Patient Details Name: Stacy Pittman MRN: [id number] DOB: Apr 16, 1932   Cancelled Treatment:    Reason Eval/Treat Not Completed: Medical issues which prohibited therapy (pt with sats 79% on NRB with RN awaiting transfer back to Bipap and requested hold therapy today stating pt with decreased mobility participation as well)   Paxton Binns B Kendra Woolford 03/25/2014, 10:11 AM Elwyn Reach, [place]

## 2014-03-26 ENCOUNTER — Encounter (HOSPITAL_COMMUNITY): Payer: Self-pay

## 2014-03-26 DIAGNOSIS — Z515 Encounter for palliative care: Secondary | ICD-10-CM

## 2014-03-26 DIAGNOSIS — N179 Acute kidney failure, unspecified: Secondary | ICD-10-CM

## 2014-03-26 DIAGNOSIS — Z66 Do not resuscitate: Secondary | ICD-10-CM

## 2014-03-26 LAB — BASIC METABOLIC PANEL
BUN: 29 mg/dL — ABNORMAL HIGH (ref 6–23)
CO2: 18 meq/L — AB (ref 19–32)
CREATININE: 1.46 mg/dL — AB (ref 0.50–1.10)
Calcium: 8.6 mg/dL (ref 8.4–10.5)
Chloride: 111 mEq/L (ref 96–112)
GFR calc Af Amer: 38 mL/min — ABNORMAL LOW (ref >90)
GFR calc non Af Amer: 33 mL/min — ABNORMAL LOW (ref >90)
Glucose, Bld: 187 mg/dL — ABNORMAL HIGH (ref 70–99)
POTASSIUM: 3.6 meq/L — AB (ref 3.7–5.3)
Sodium: 147 mEq/L (ref 137–147)

## 2014-03-26 LAB — CBC
HCT: 30.6 % — ABNORMAL LOW (ref 36.0–46.0)
Hemoglobin: 9.3 g/dL — ABNORMAL LOW (ref 12.0–15.0)
MCH: 21.8 pg — ABNORMAL LOW (ref 26.0–34.0)
MCHC: 30.4 g/dL (ref 30.0–36.0)
MCV: 71.7 fL — AB (ref 78.0–100.0)
PLATELETS: 135 10*3/uL — AB (ref 150–400)
RBC: 4.27 MIL/uL (ref 3.87–5.11)
RDW: 23.1 % — ABNORMAL HIGH (ref 11.5–15.5)
WBC: 28.3 10*3/uL — ABNORMAL HIGH (ref 4.0–10.5)

## 2014-03-26 LAB — UREA NITROGEN, URINE: Urea Nitrogen, Ur: 367 mg/dL

## 2014-03-26 MED ORDER — INSULIN ASPART 100 UNIT/ML ~~LOC~~ SOLN: 0.0000 [IU] | SUBCUTANEOUS | Status: DC

## 2014-03-26 MED ORDER — SODIUM CHLORIDE 0.9 % IV SOLN
1.0000 mg/h | INTRAVENOUS | Status: DC
  Administered 2014-03-26: 2 mg/h via INTRAVENOUS
  Filled 2014-03-26: qty 10

## 2014-03-26 MED ORDER — LORAZEPAM 2 MG/ML IJ SOLN: 0.5000 mg | INTRAMUSCULAR | Status: DC | PRN

## 2014-03-26 MED ORDER — POTASSIUM CHLORIDE 20 MEQ/15ML (10%) PO LIQD
40.0000 meq | Freq: Once | ORAL | Status: DC
  Filled 2014-03-26: qty 30

## 2014-03-26 MED ORDER — FUROSEMIDE 10 MG/ML IJ SOLN
40.0000 mg | Freq: Once | INTRAMUSCULAR | Status: AC
  Administered 2014-03-26: 40 mg via INTRAVENOUS
  Filled 2014-03-26: qty 4

## 2014-03-26 MED ORDER — MORPHINE BOLUS VIA INFUSION
2.0000 mg | INTRAVENOUS | Status: DC | PRN
Start: 2014-03-26 — End: 2014-03-26
  Filled 2014-03-26: qty 2

## 2014-03-26 MED ORDER — SCOPOLAMINE 1 MG/3DAYS TD PT72
1.0000 | MEDICATED_PATCH | TRANSDERMAL | Status: DC
  Administered 2014-03-26: 1.5 mg via TRANSDERMAL
  Filled 2014-03-26: qty 1

## 2014-03-26 MED ORDER — LORAZEPAM 2 MG/ML IJ SOLN
0.5000 mg | Freq: Once | INTRAMUSCULAR | Status: AC
  Administered 2014-03-26: 0.5 mg via INTRAVENOUS
  Filled 2014-03-26: qty 1

## 2014-04-12 NOTE — Progress Notes (Signed)
Family Medicine Teaching Service Daily Progress Note Intern Pager: [phone]  Patient name: Stacy Pittman Medical record number: [id number] Date of birth: 1932-08-13 Age: 80 y.o. Gender: female  Primary Care Provider: Margarita Rana, MD Consultants: neuro, cards Code Status: DNR  Pt Overview and Major Events to Date:  4/7: LOC w/ seizure-like activity and left side weakness 4/8: Extensive infarction throughout the cerebellum 4/9: CIR recommended; awaiting evaluation 4/10: Afib w/ RVR - dilt drip, restarted amnio PO, CXR Inc perihilar infiltration RUL 4/13: Strep Pneumo + PNA, Ecoli UTI; Placed on BiPAP  Assessment and Plan: Stacy Pittman is a 80 y.o. female presenting with loss of consciousness and TIA symptoms . PMH is significant for HTN, COPD, HLD, afib, and CHF.   # CVA w/ seizure-like activity - EEG: normal, no epileptiform  - MRI/MRA   Extensive infarction throughout the cerebellum, mostly in the region of the superior cerebellum but with some involvement inferiorly on the right.   Swelling with mass effect upon the fourth ventricle but no ventricular obstruction at this moment. No sign of hemorrhage.   Extensive chronic small vessel disease throughout the cerebral hemispheric white matter.   Chronic right ICA occlusion. Left ICA sufficiently patent. Both vertebral arteries are patent.  -neuro consulted f/u their recs   Switched to Eliquis 4/9  Neuro signed off: followup with Dr. Leonie Man in 4-6 weeks following discharge - ECHO  akinesis of the apex and distal septal wall. The Study is inadequate to rule out apical thrombus by this study. Recommend Cardiac MRI to further delineate for LV thrombus  EF 06-30%, Diastolic dysfcn grade 3  Pulmonary HTN: PA pressure = 57 -SLP: Dysphagia 3 (mechanical soft, thin liquid) - Lipitor inc to 40 mg qhs - Ativan 0.5 qhs; attempting to taper off benzo, but did have seizure-like activity on admission most likely due to Stroke  - On  Xanax 0.5mg  qhs at home but reports taking two nightly; Held last night w/ inc SOB -PT recs: SNF; OT recs CIR  Awaiting CIR Insurance approval pending clinical improvement  # ID: UTI & PNA( Strep Pneumo +); Abd soft and non-tender.  - Fevers afebrile x 48hrs: Last 100.4 on 4/12 @ 9am - WBC: Continues to trend up (steroids started 4/12): 28.3 (4/14) > 27 (4/13) > 24.4 (4/12) ... 14.4 (admit 4/7)  - Urine culture (4/9): Ecoli - pan sensitive - CXR 4/11: Worsening right upper lobe pneumonia; Urine Strep Antigens + - Abx: Vanc/Zosyn (4/11>>)   to cover PNA (Strep Pneumo +/- possible aspiration) and UTI given increasing WBC and continued fevers on Levaquin (4/9 >>4/13)  # COPD: (home O2 requirement 2L [place]). Patient uses Advair and albuterol at home - O2 requirement: Off Bipap this morning to Nonrebreather;  Likely due to PNA + COPD exacerbation - Dulera BID - scheduled duonebs q6hrs  - monitor respiratory status, and continue Abx as above - Prednisolone 60mg  IV x 5 days (4/13>>) for possibe COPD exacerbation - Palliative care [place] discussion today  # Afib: rate controlled on admission - On Eliquis - Cardiology consulted:  Amiodarone  IV  Diltiazem drip  Lopressor 2.5 q 4hrs - Rate last 24hrs: 100s-110s, BP stable  # Anemia: Hgb 11.3 on admission. This is microcytic. No mention of bleeding on interview.  - Hgb: stable 9.3 (4/14) = 9.3 (4/12) < 9.1 (4/11) < 9.9 (4/10) < 10 (4/9) 11.3 on 4/7 - monitor for bleeding  - Will start PO iron on discharge  # AKI on CKD:  - Cr  Increasing: 1.46 (4/14) 1.24 (4/13) > 0.89 (4/10) < 1.16 with GFR 43.  - continue to monitor Cr  # Hyperglycemia - CBGs 200s - Start SSI sensitive q4hrs due to elevated CBGs and starting steroids: 6u given in last 24hrs  # Hypokalemia  - Monitoring and replacing as needed - Oral potassium solution: 40mg  once today  # Hypernatremia: Resolved  # Abdominal distension:  - KUB 4/12: No gas-filled loop of dilated  bowel identified to suggest obstruction.  FEN/GI: nectar thick liquids due to concerns for aspiration; KVO Prophylaxis: On Eliquis  Disposition: Possible CIR pending improvement in respiratory status  Subjective:  Says she feels better this morning, denies CP or abdominal pain.   Objective: Temp:  [98.1 F (36.7 C)-98.4 F (36.9 C)] 98.3 F (36.8 C) (04/14 0345) Pulse Rate:  [97-115] 114 (04/14 0334) Resp:  [15-30] 29 (04/14 0600) BP: (94-128)/(42-63) 126/63 mmHg (04/14 0334) SpO2:  [88 %-100 %] 91 % (04/14 0600) FiO2 (%):  [80 %] 80 % (04/14 0600)  Physical Exam: General: Laying in bed w/ nonrebreather on  Cardiovascular: Irreg, Irreg rhythm, intermittent tachycardia (100s), no murmurs Respiratory: Rhonchi throught Rt lung fields; decreased breath sounds in lower lung fields b/l Abdomen: soft, non-tender Extremities: Cool but pulses palpable, Trace edema  Laboratory:  Recent Labs Lab 03/24/14 0324 03/25/14 0328 03/29/2014 0319  WBC 24.4* 27.0* 28.3*  HGB 9.3* 9.3* 9.3*  HCT 30.2* 30.1* 30.6*  PLT 139* 141* 135*    Recent Labs Lab 2014/04/12 1651  03/25/14 0328 03/25/14 0820 04/04/2014 0319  NA 140  < > 145 148* 147  K 4.3  < > 3.4* 3.4* 3.6*  CL 102  < > 108 110 111  CO2 22  < > 20 20 18*  BUN 25*  < > 21 22 29*  CREATININE 1.16*  < > 1.24* 1.20* 1.46*  CALCIUM 8.9  < > 8.3* 8.3* 8.6  PROT 6.9  --   --   --   --   BILITOT 0.5  --   --   --   --   ALKPHOS 80  --   --   --   --   ALT 24  --   --   --   --   AST 40*  --   --   --   --   GLUCOSE 116*  < > 221* 186* 187*  < > = values in this interval not displayed. Cardiac Panel (last 3 results) No results found for this basename: CKTOTAL, CKMB, TROPONINI, RELINDX,  in the last 72 hours D-Dimer: 0.70  Imaging/Diagnostic Tests:  Dg Chest 2 View  04-12-14 IMPRESSION: 1. Cardiac enlargement and chronic interstitial coarsening. 2. Atherosclerosis.   Electronically Signed   By: Kerby Moors M.D.   On:  2014/04/12 17:28   Ct Head Wo Contrast  Apr 12, 2014 IMPRESSION: 1. Small vessel ischemic disease and brain atrophy. 2. No acute intracranial abnormalities   Electronically Signed   By: Kerby Moors M.D.   On: Apr 12, 2014 17:21   Ct Angio Chest Pe W/cm &/or Wo Cm  03/20/2014 IMPRESSION: 1. No evidence of pulmonary embolus. 2. Trace bilateral pleural effusions noted. Bibasilar airspace opacification may reflect atelectasis or pneumonia. Interstitial prominence also seen; this could reflect minimal interstitial edema. 3. Underlying emphysema noted bilaterally. 4. Mild biatrial enlargement seen. 5. Diffuse coronary artery calcifications seen.   Electronically Signed   By: Garald Balding M.D.   On: 03/20/2014 03:37   MRI HEAD FINDINGS  03/20/14 There are large regions of acute infarction within the superior  cerebellum on both sides. Some involvement of the inferior  cerebellum is present on the right. The region of infarction shows  swelling with mass effect upon the fourth ventricle. Ventricular  size is unchanged at this moment, but with further swelling there  could be obstructive hydrocephalus. No acute infarction in the  supratentorial brain. There are extensive chronic small vessel  changes throughout the white matter. No large vessel territory supra  tentorial infarction. No mass lesion. No extra-axial collection. No  pituitary mass. No inflammatory sinus disease. No skull or skullbase  lesion.  MRA HEAD FINDINGS  The right internal carotid artery is occluded to the level of the  skullbase. Left internal carotid artery shows atherosclerotic  irregularity in the carotid siphon region but no stenosis. The left  anterior and middle cerebral vessels show mild atherosclerotic  irregularity but are widely patent. Patent anterior communicating  artery give supply to the anterior circulation on the right, and  there is fairly normal visualization of the right anterior and  middle cerebral artery.  Also contributing is a patent posterior  communicating artery on this side.  Both vertebral arteries are patent with the left being diminutive in  the right being dominant. No basilar stenosis. Left PICA is patent.  Right PICA not demonstrated. Anterior inferior cerebellar arteries  are present bilaterally. There is some flow in the superior  cerebellar arteries bilaterally. Both posterior cerebral arteries  show flow and good distal opacification.  IMPRESSION:  Extensive infarction throughout the cerebellum, mostly in the region  of the superior cerebellum but with some involvement inferiorly on  the right. Swelling with mass effect upon the fourth ventricle but  no ventricular obstruction at this moment. No sign of hemorrhage.  Extensive chronic small vessel disease throughout the cerebral  hemispheric white matter.  Chronic right ICA occlusion. Left ICA sufficiently patent with  atherosclerotic irregularity in the siphon region. Good flow  demonstrated in both the anterior and middle cerebral artery  territories because of patent anterior and posterior communicating  arteries.  Both vertebral arteries are patent. All major posterior circulation  branches are demonstrated presently with the exception of right  PICA. This includes flow within both superior cerebellar arteries at  this time.   PORTABLE CHEST - 1 VIEW  COMPARISON: 04/10/2014  FINDINGS:  Cardiac enlargement. There is increased patchy airspace disease in  the right lung with small but increasing bilateral pleural  effusions. Calcification of the aorta. No pneumothorax.  IMPRESSION:  Increasing perihilar infiltration on the right in the increasing  bilateral pleural effusions.  CXR 4/13 FINDINGS:  As with the recent prior examinations there is multifocal  interstitial and patchy airspace disease throughout the lungs  bilaterally, most confluent throughout the left lung base and  throughout the entire right lung.  Overall, aeration appears very  similar to be recent prior examination. Moderate right pleural  effusion. Possible small left pleural effusion. Mild cardiomegaly is  unchanged. No evidence of pulmonary edema. Upper mediastinal  contours are distorted by patient's rotation to the right.  Atherosclerosis in the thoracic aorta.  IMPRESSION:  1. Findings remain concerning for multilobar pneumonia, as above.  2. Moderate right pleural effusion and probable small left pleural  effusion.  3. Mild cardiomegaly.  4. Atherosclerosis.   Phill Myron, MD 04/09/14, 6:59 AM PGY-1, [place] Intern pager: [phone], text pages welcome

## 2014-04-12 NOTE — Progress Notes (Signed)
Chaplain Note:  i responded to a consult request for support of family of Mrs. Harpham. Patient is near the end of her life and her children and grandchildren were present. They appear to be a close family - four sons and a daughter. They mentioned another brother who died a few years ago of cancer.   The family spoke affectionately of their mother and their admiration for her as " a good woman.."  They also talked about her health problems over the last months and what a " strong person she has been.   They asked me to pray for her and for them which I did.  I have passed on a referral for support to the night on-call chaplain.   Wells Guiles  Chaplain [phone]

## 2014-04-12 NOTE — Consult Note (Signed)
Palliative Medicine Team at [facility]  Date: 03/16/2014   Patient Name: Stacy Pittman  DOB: 1932-05-05  MRN: [id number]  Age / Sex: 80 y.o., female   PCP: Margarita Rana, MD Referring Physician: Blane Ohara McDiarmid, MD  HPI/Reason for Consultation: 80 yo woman admitted on 03/25/2014 with syncope, left sided weakness and potential seizure found to have large right cerebellar stroke on MRI- she developed acute respiratory failure on 4/12 and has a rapidly progressive multifocal PNA with strep pnuemo positive urinary antigen. She has been on bipap for >24 hours, continues to decline. Mental status has also deteriorated.   Participants in Discussion: Patient's son and patient  Goals/Summary of Discussion:  1. Code Status:  DNR  2. Scope of Treatment:  Comfort care only  3. Assessment/Plan:  Primary Diagnoses  1. Acute respirtory Failure 2. Multifocal PNA 3. Cerebellar stroke 4. Afib   Prognosis: hours-day- suspect rapid decline post removal of bipap  PPS 20%    Active Symptoms 1. Dyspnea  4. Palliative Prophylaxis:   Bowel Regimen   Terminal Secretions  Breakthrough Pain and Dyspnea   Agitation and Delirium  Nausea  5. Psychosocial Spiritual Asssessment/Interventions:  Patient and Family Adjustment to Illness/Prognosis: Coping well-anticipated her death.   Spiritual Concerns or Needs: chaplain consult  6. Disposition: Anticipate hospital death, family on their way to the hospital.  Time: 50 minutes : 11:10:12:00P Greater than 50%  of this time was spent counseling and coordinating care related to the above assessment and plan.  Signed by: Acquanetta Chain, DO  04/03/2014, 11:47 AM  Please contact Palliative Medicine Team phone at [phone] for questions and concerns.

## 2014-04-12 NOTE — Progress Notes (Addendum)
PGY-2 Interim Note LATE ENTRY OF NOTE (met with family around 1 PM)  Met with family at bedside. Pt currently on nonrebreather with comfort measures being initiated; breathing is slightly labored but pt is in no clear distress. Per, RN, pulse ox read ~60% on nonrebreather mask before monitoring discontinued. Informed family that providers will be available at any time if any questions or needs arise and reinforced letting RN (and by extension, [place]) know if pt appears to be in discomfort.  Family members report they have no questions at this time after discussion with Dr. Hilma Favors this morning. FPTS greatly appreciates assistance from palliative care as well as all consultants and ancillary staff in caring for this patient and her family.  Emmaline Kluver, MD PGY-2, [place] Medicine 04-15-14, 2:19 PM [place] Service pager: [phone] (text pages welcome through [place])  Addendum: D/C'd remaining nonessential orders, I&O, vitals, telemetry monitoring / pulse oximetry, scheduled Duonebs.

## 2014-04-12 NOTE — Progress Notes (Signed)
Noted plans for comfort care. I will sign off. [phone]

## 2014-04-12 NOTE — Progress Notes (Signed)
PGY-2 Update Note  Paged by RN around 5:15 PM informing that pt had expired. Visited family at bedside who expressed no needs at this time and repeatedly expressed thanks for pt's care from providers and nursing staff. Appreciate all assistance with this patient. Death summary forthcoming.  Emmaline Kluver, MD PGY-2, [place] [facility] 04/08/2014, 5:50 PM [place] Service pager: [phone] (text pages welcome through [place])

## 2014-04-12 NOTE — Progress Notes (Signed)
Attempted to see pt today. Pt continues be on Bipap with no reserve.  Nursing asked to hold therapy for today.  Family and pt meeting with Palliative Care today.  Will also follow up tomorrow with plan of care in regards to further therapy. Jinger Neighbors, [place] [phone]

## 2014-04-12 NOTE — Discharge Summary (Addendum)
[facility] Death Summary  Patient name: Stacy Pittman Medical record number: [id number] Date of birth: [birth year] Age: 80 y.o. Gender: female Date of Admission: 04/01/14  Date of Death: April 08, 2014 Admitting Physician: Blane Ohara McDiarmid, MD  Primary Care Provider: Margarita Rana, MD Consultants: Neurology, Cardiology  Indication for Hospitalization: CVA  Discharge Diagnoses/Problem List:   CVA  Cerebral edema w/ mass effect  PNA (strep pneumo +)  UTI ( Ecoli )  Afib  Respiratory failure  COPD  Anemia  AKI on CKD  Disposition: Died  Brief Hospital Course:  Stacy Pittman is a 80 y.o. female presenting with loss of consciousness and TIA symptoms and found to have extensive CVA. PMH is significant for HTN, COPD, HLD, afib, and CHF. MRI/MRA showed Extensive infarction throughout the cerebellum, mostly in the region of the superior cerebellum but with some involvement inferiorly on the right. Cerebral edema / swelling with mass effect upon the fourth ventricle but no ventricular obstruction, and Chronic right ICA occlusion. Neurology was consulted and she was switched from Warfarin to Eliquis. PT/OT recommended CIR. Prior to being able to transfer to CIR she developed fevers, leukocytosis, and tachycardia and work-up revealed Strep pneumonia positive PNA, E coli UTI and Afib w/ RVR. Cardiology was consulted for her Afib, and her antibiotics switched to Vanc/Zosyn. Her respiratory function declined and she was moved to Step-down and placed on BiPAP. Palliative care was consulted and confirmed that she was DNR and family made decision to move towards comfort care and did not want intubation. She was given prn morphine for pain and air hunger, and passed away on Apr 08, 2014 @ approximately 5:15PM with family at bedside.   Significant Procedures: EEG  Significant Labs and Imaging:   Recent Labs Lab 01-Apr-2014 1651 03/20/14 0422 03/21/14 0700  WBC 14.4* 12.9*  14.4*  HGB 11.3* 10.5* 10.0*  HCT 37.3 34.4* 33.0*  PLT 159 127* 135*    Recent Labs Lab 2014-04-01 1651 03/20/14 0422 03/21/14 0700  NA 140 139 143  K 4.3 5.3 4.4  CL 102 104 107  CO2 22 19 20   GLUCOSE 116* 126* 139*  BUN 25* 26* 20  CREATININE 1.16* 1.09 0.85  CALCIUM 8.9 8.2* 8.6  ALKPHOS 80  --   --   AST 40*  --   --   ALT 24  --   --   ALBUMIN 3.2*  --   --    Urinalysis    Component Value Date/Time   COLORURINE YELLOW 03/21/2014 0952   APPEARANCEUR HAZY* 03/21/2014 0952   LABSPEC 1.027 03/21/2014 0952   PHURINE 5.5 03/21/2014 0952   GLUCOSEU NEGATIVE 03/21/2014 0952   HGBUR NEGATIVE 03/21/2014 0952   BILIRUBINUR NEGATIVE 03/21/2014 [place] 03/21/2014 0952   PROTEINUR NEGATIVE 03/21/2014 0952   UROBILINOGEN 0.2 03/21/2014 0952   NITRITE POSITIVE* 03/21/2014 0952   LEUKOCYTESUR LARGE* 03/21/2014 0952     Recent Labs Lab 04-01-2014 2048 03/20/14 0422 03/20/14 0800  TROPONINI <0.30 <0.30 <0.30   Results for orders placed during the hospital encounter of Apr 01, 2014  URINE CULTURE     Status: None   Collection Time    03/21/14  9:52 AM      Result Value Ref Range Status   Specimen Description URINE, RANDOM   Final   Special Requests ADD [id number]   Final   Culture  Setup Time     Final   Value: 03/23/2014 19:57     Performed at [facility]   [place] Count     Final   Value: >=100,000 COLONIES/ML     Performed at [facility]     Final   Value: ESCHERICHIA COLI     Performed at [facility]   Report Status 03/25/2014 FINAL   Final   Organism ID, Bacteria ESCHERICHIA COLI   Final  MRSA PCR SCREENING     Status: Abnormal   Collection Time    03/23/14  9:11 PM      Result Value Ref Range Status   MRSA by PCR POSITIVE (*) NEGATIVE Final   Strep Pneumo Urinary Antigen: Positive  PORTABLE CHEST - 1 VIEW 03/25/14 IMPRESSION:  1. Findings remain concerning for multilobar pneumonia, as above.  2. Moderate right pleural  effusion and probable small left pleural  effusion.  3. Mild cardiomegaly.  4. Atherosclerosis.  PORTABLE CHEST - 1 VIEW  03/24/14 IMPRESSION:  Stable left basilar opacity consistent with pneumonia or atelectasis  with associated pleural effusion. Worsening right upper lobe  pneumonia, with persistent right lower lobe atelectasis and pleural  effusion.  Dg Chest 2 View  04/08/2014  IMPRESSION: 1. Cardiac enlargement and chronic interstitial coarsening. 2. Atherosclerosis. Electronically Signed By: Kerby Moors M.D. On: 03/13/2014 17:28   Ct Head Wo Contrast  04/04/2014  IMPRESSION: 1. Small vessel ischemic disease and brain atrophy. 2. No acute intracranial abnormalities Electronically Signed By: Kerby Moors M.D. On: 03/29/2014 17:21   Ct Angio Chest Pe W/cm &/or Wo Cm  03/20/2014  IMPRESSION: 1. No evidence of pulmonary embolus. 2. Trace bilateral pleural effusions noted. Bibasilar airspace opacification may reflect atelectasis or pneumonia. Interstitial prominence also seen; this could reflect minimal interstitial edema. 3. Underlying emphysema noted bilaterally. 4. Mild biatrial enlargement seen. 5. Diffuse coronary artery calcifications seen. Electronically Signed By: Garald Balding M.D. On: 03/20/2014 03:37   MRI HEAD FINDINGS 03/20/14  IMPRESSION:  Extensive infarction throughout the cerebellum, mostly in the region  of the superior cerebellum but with some involvement inferiorly on  the right. Swelling with mass effect upon the fourth ventricle but  no ventricular obstruction at this moment. No sign of hemorrhage.  Extensive chronic small vessel disease throughout the cerebral  hemispheric white matter.  Chronic right ICA occlusion. Left ICA sufficiently patent with  atherosclerotic irregularity in the siphon region. Good flow  demonstrated in both the anterior and middle cerebral artery  territories because of patent anterior and posterior communicating  arteries.  Both  vertebral arteries are patent. All major posterior circulation  branches are demonstrated presently with the exception of right  PICA. This includes flow within both superior cerebellar arteries at  this time.   ECHO Study Conclusions - Left ventricle: Endocardial segments are poorly visualized but there isw a suggestion of akinesis of the apex and distal septal wall. The Study is inadequate to rule out apical thrombus by this study. Recommend Cardiac MRI to further delineate for LV thrombus and better assessment of wall motion if clinically indicated The cavity size was normal. Systolic function was mildly reduced. The estimated ejection fraction was in the range of 45% to 50%. There was a reduced contribution of atrial contraction to ventricular filling, due to increased ventricular diastolic pressure or atrial contractile dysfunction. Doppler parameters are consistent with a reversible restrictive pattern, indicative of decreased left ventricular diastolic compliance and/or increased left atrial pressure (grade 3 diastolic dysfunction). - Aortic valve: Moderate thickening and calcification, consistent with sclerosis. - Pulmonary arteries: PA peak pressure:  48mm Hg (S). Impressions:  - The right ventricular systolic pressure was increased consistent with moderate pulmonary hypertension.  Phill Myron, MD 03/21/2014, 11:19 PM PGY-1, [place]

## 2014-04-12 NOTE — Progress Notes (Signed)
   Palliative and withdrawal of care plan noted. Will sign off.  Troy Sine, MD 03/25/2014 12:39 PM

## 2014-04-12 NOTE — Progress Notes (Signed)
PT Cancellation Note  Patient Details Name: Stacy Pittman MRN: [id number] DOB: Apr 26, 1932   Cancelled Treatment:    Reason Eval/Treat Not Completed: Medical issues which prohibited therapy (pt remains on bipap without reserve per RN who stated to hold again today. Palliative to see today and also await POC for therapy continuation.)   Lin Glazier B Geraldyne Barraclough 04/08/2014, 10:01 AM Elwyn Reach, [place]

## 2014-04-12 NOTE — Progress Notes (Signed)
FMTS Attending Daily Note:  Annabell Sabal MD  [phone] pager  Family Practice pager:  [phone] I have seen and examined this patient and have reviewed their chart. I have discussed this patient with the resident. I agree with the resident's findings, assessment and care plan.  Additionally:  Acute respiratory failure -- Bipap now over 24 hours straight.  Unable to tolerate more than 10 minutes off yesterday.  On my exam today sats 88-91% on NRB at 15 L.  States she is "tired."  Agree with palliative GOC/family meeting discussion.  Fluids and lasix from yesterday noted.  She has pleural effusions noted on CXR and exam.  Stop fluids.  1 dose 40 mg Lasix now.  Rising creatinine noted.  WBC hovering high 20's.  Poor prognosis.    Alveda Reasons, MD 24-Apr-2014 8:54 AM

## 2014-04-12 NOTE — Progress Notes (Signed)
Pt transferred to [place]. Note from pt's previous CSW indicated CSW involvement; per palliative note, disposition is hours-days. CSW following to provide support to pt on comfort care.   Ky Barban, MSW, [facility] Clinical Social Worker [phone]

## 2014-04-12 DEATH — deceased

## 2014-05-18 IMAGING — CR DG CHEST 2V
1 series · 2 of 2 positions shown · non-contrast
Comparison: Chest CT 12/10/2013.

CLINICAL DATA: Hypoxia.

EXAM:
CHEST  2 VIEW

[Series 6: w chest pa · 0.14mm/px · 2 of 2 slices shown]
[im 1/2]
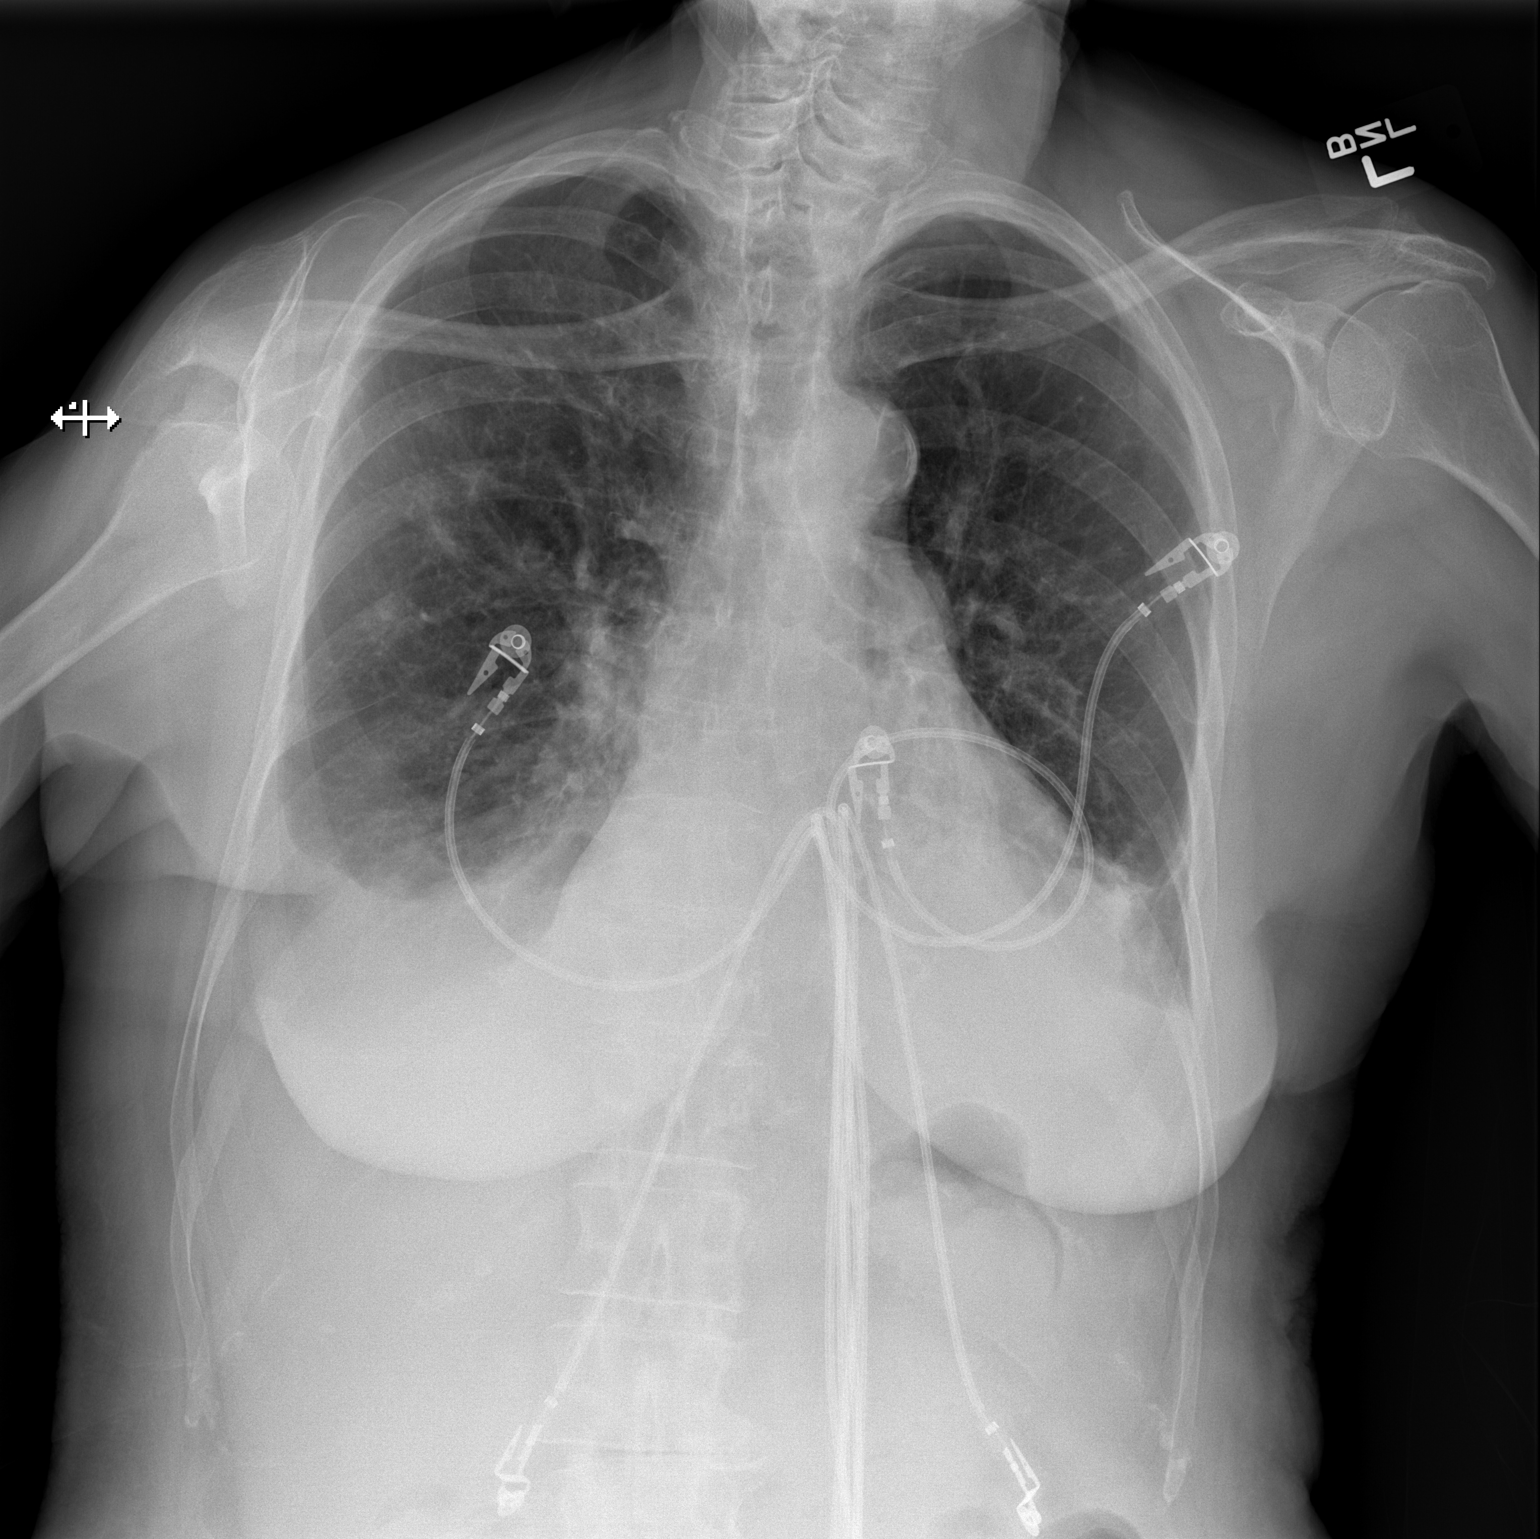
[im 2/2]
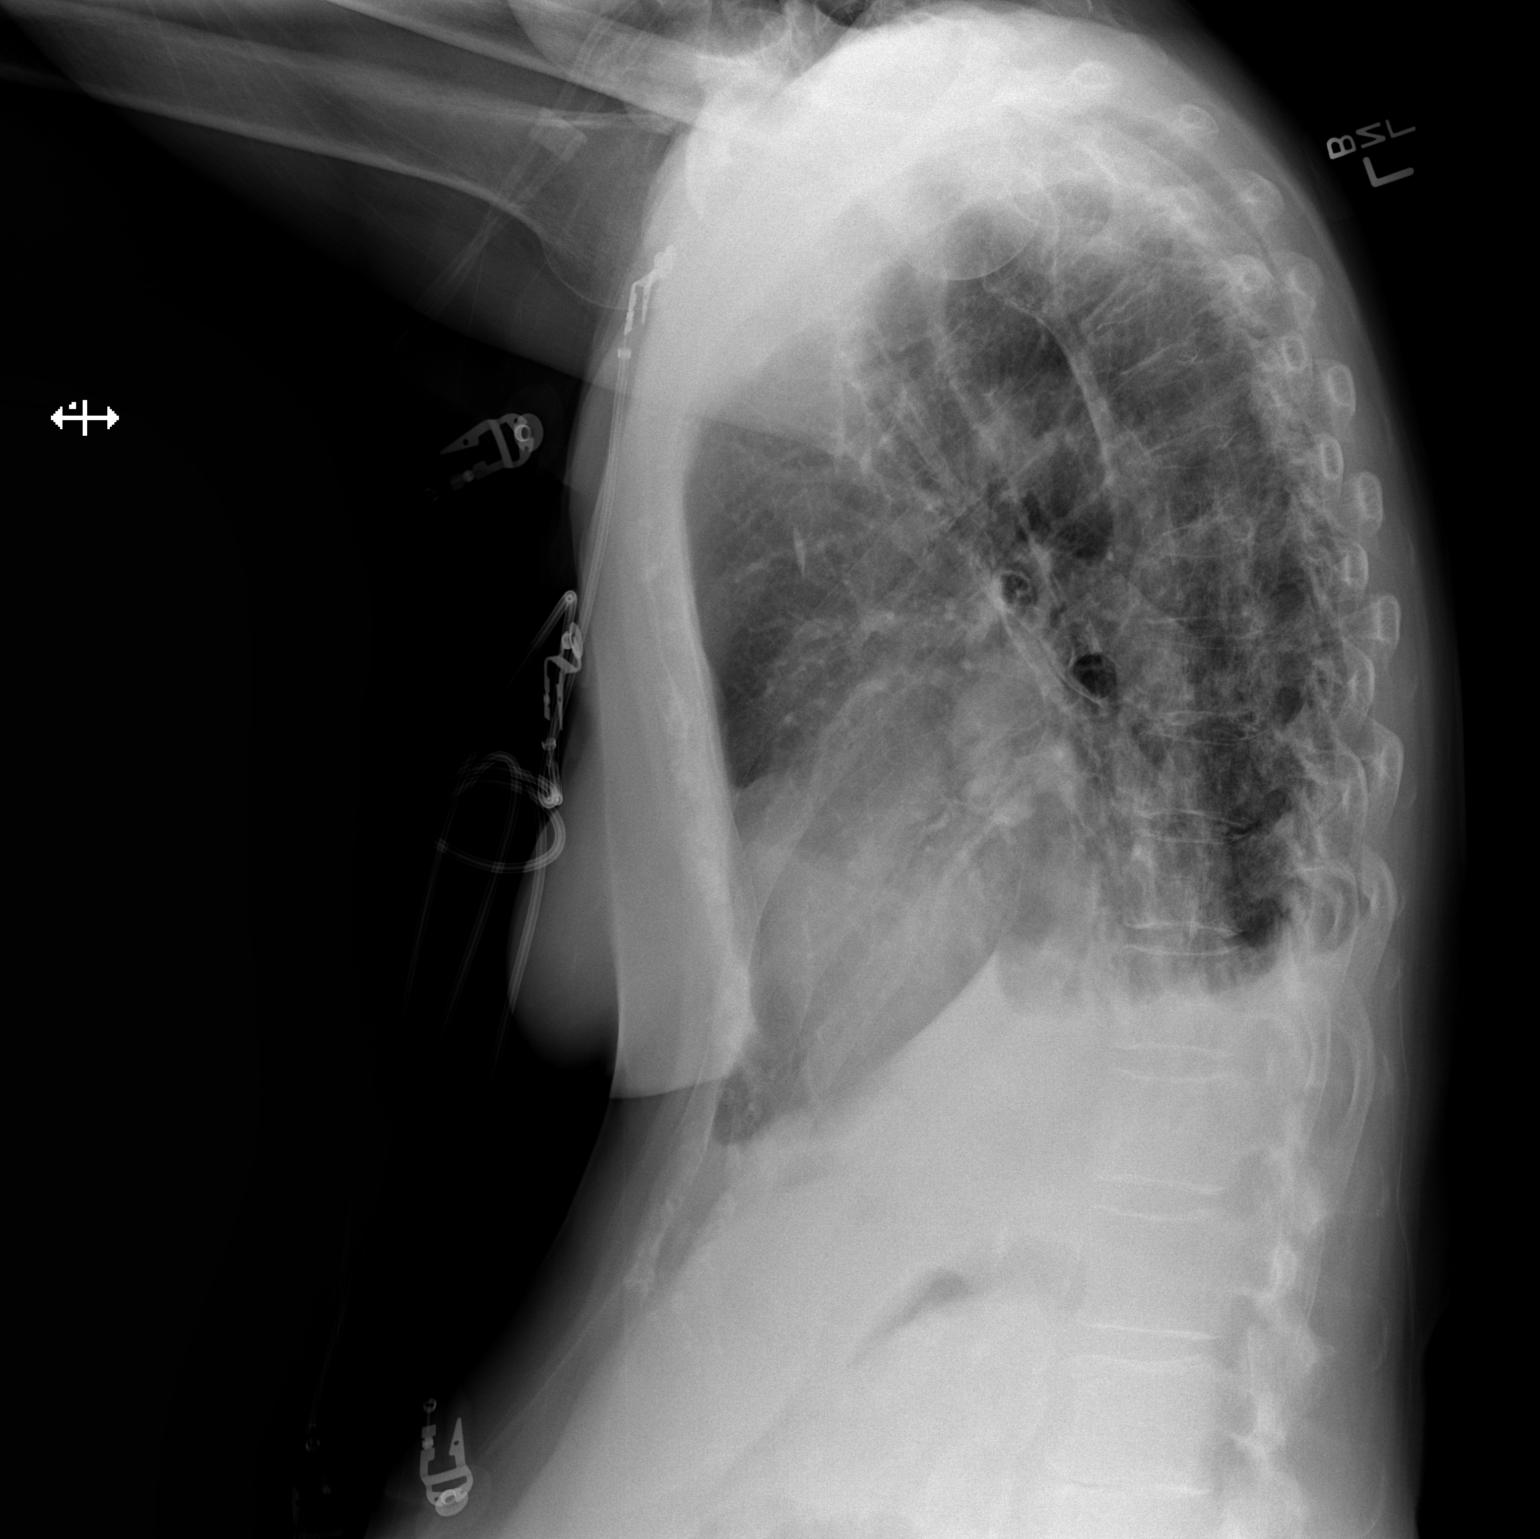

[2 of 2 positions shown; findings below may reference images not displayed]

FINDINGS: Mediastinum and hilar structures are normal. Bibasilar pulmonary
infiltrates and bilateral pleural effusions remain. Nodular
pulmonary infiltrates noted right upper lobe. No pneumothorax. No
acute osseus abnormality.
IMPRESSION: 1. Persistent bibasilar infiltrates and nodular pulmonary
infiltrates right upper lobe.
2. Persistent bilateral pleural effusions.

## 2014-05-23 IMAGING — US US GUIDE NEEDLE - US [PERSON_NAME]
1 series · 7 of 7 positions shown · non-contrast
Comparison: 12/14/2013

CLINICAL DATA: Hypoxia, right pleural effusion

EXAM:
ULTRASOUND GUIDED RIGHT THORACENTESIS

[Series 1: us guide needle - us (person_name) · 0.21mm/px · 7 of 7 slices shown]
[im 1/7]
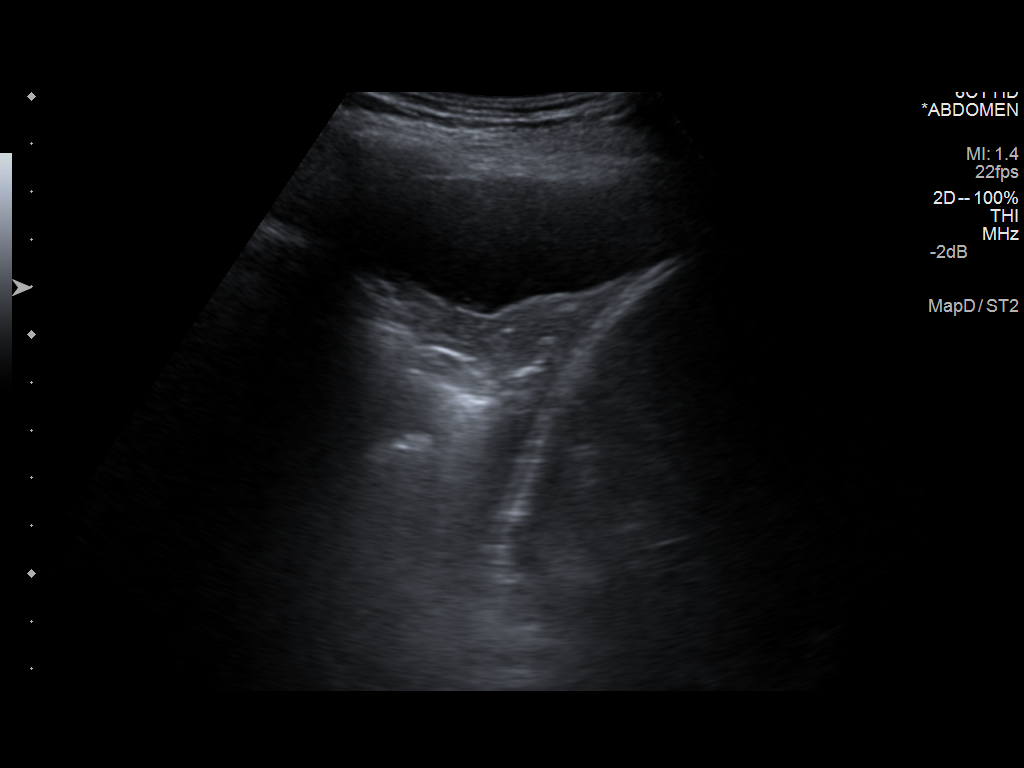
[im 2/7]
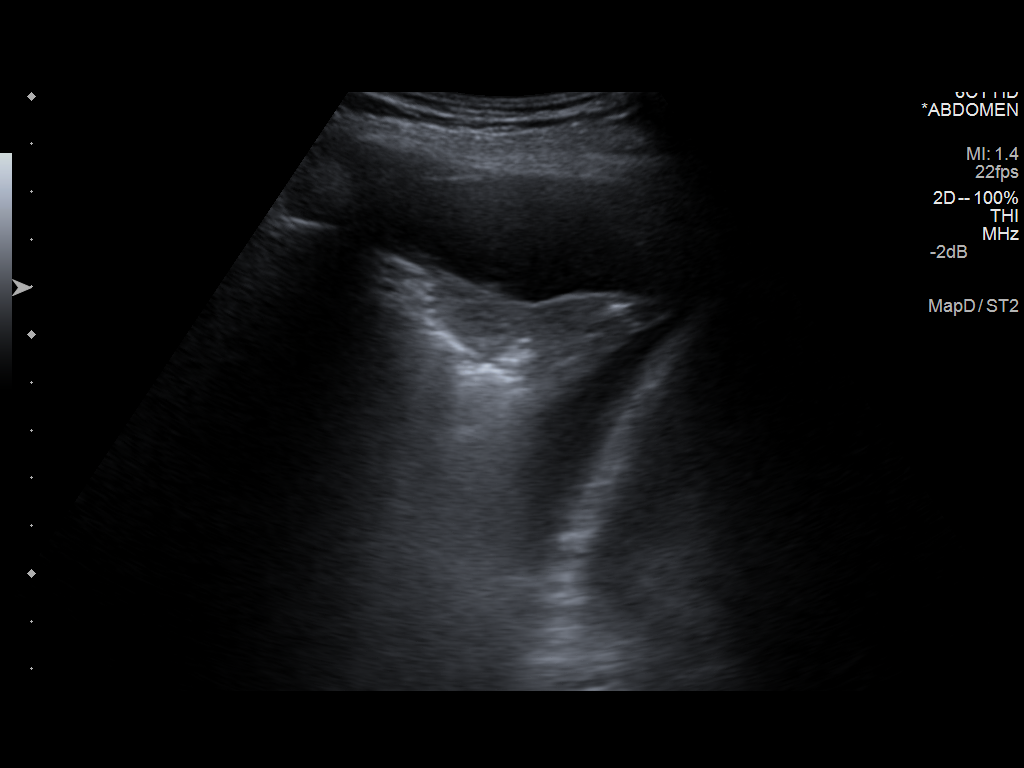
[im 3/7]
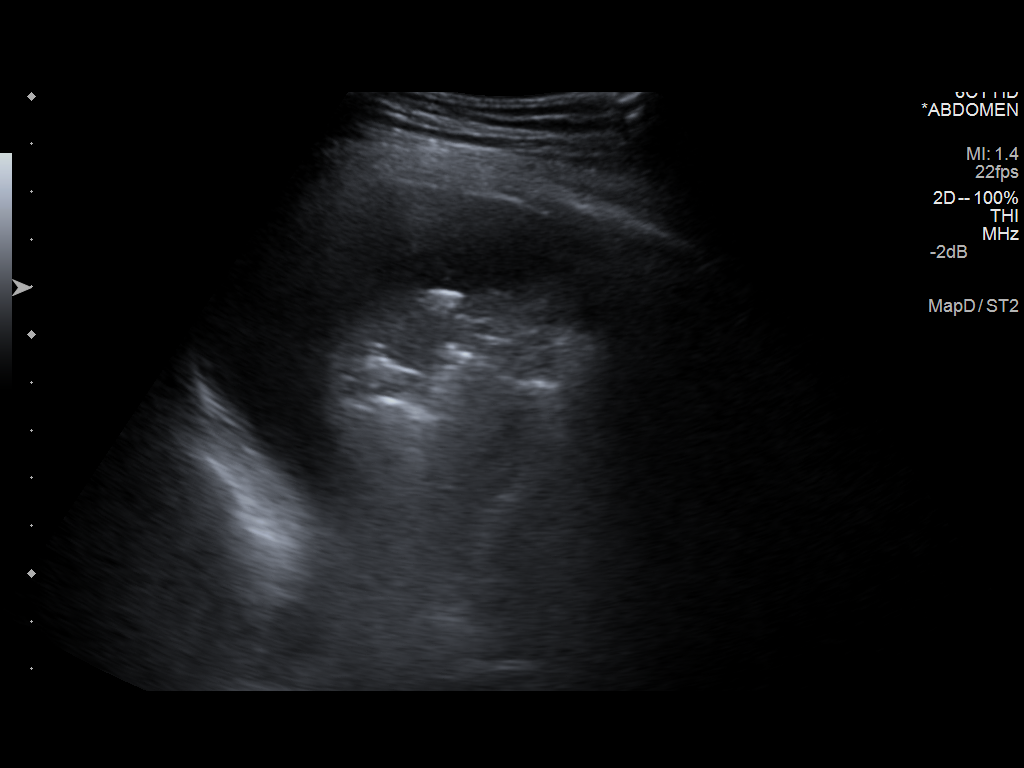
[im 4/7]
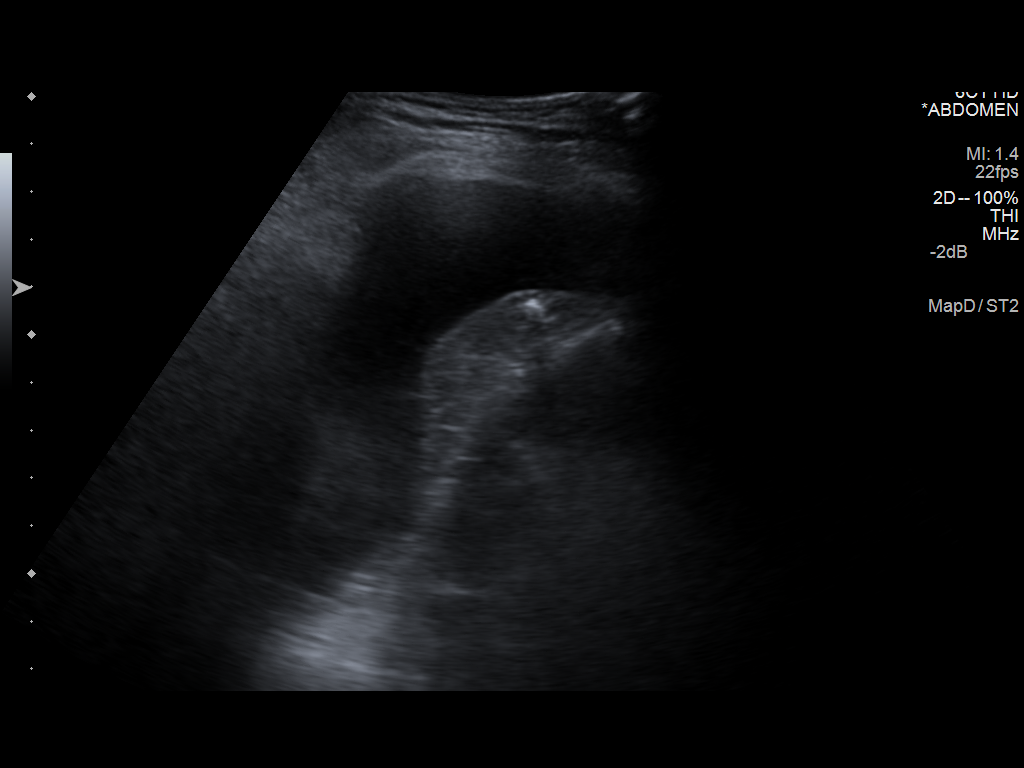
[im 5/7]
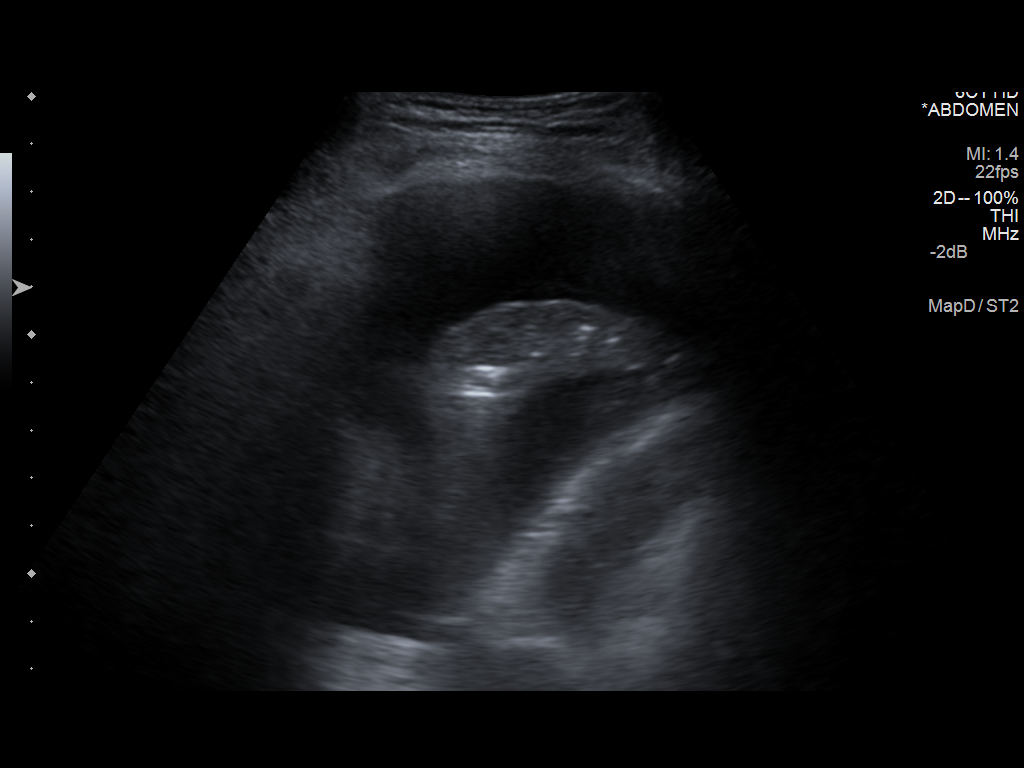
[im 6/7]
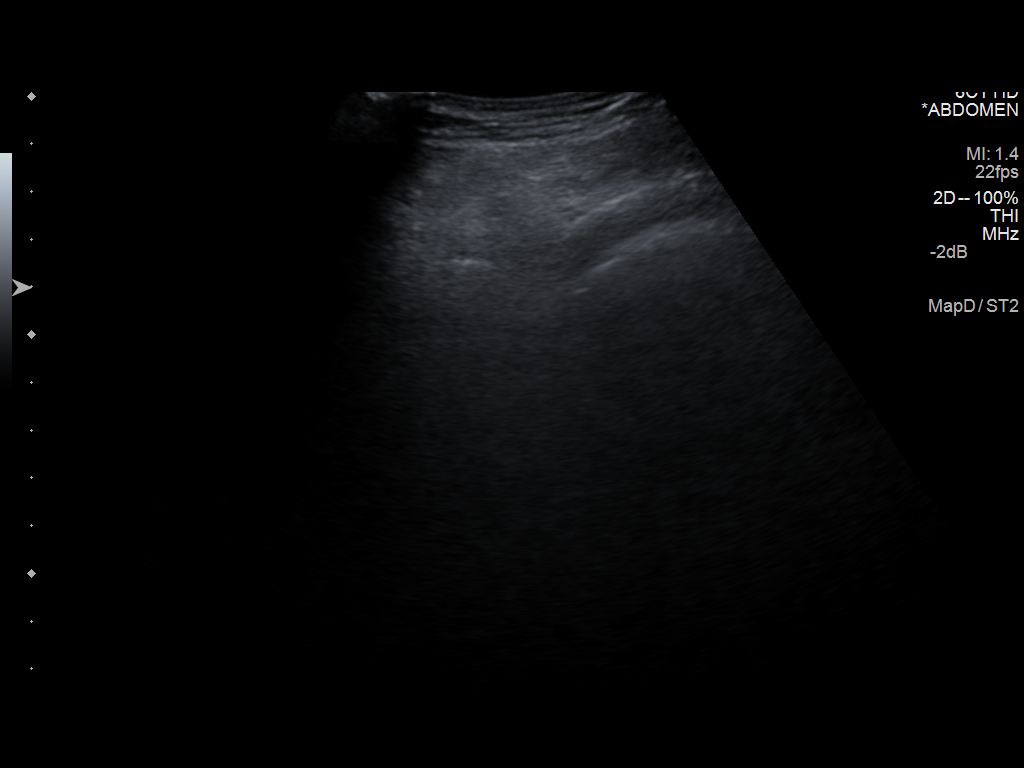
[im 7/7]
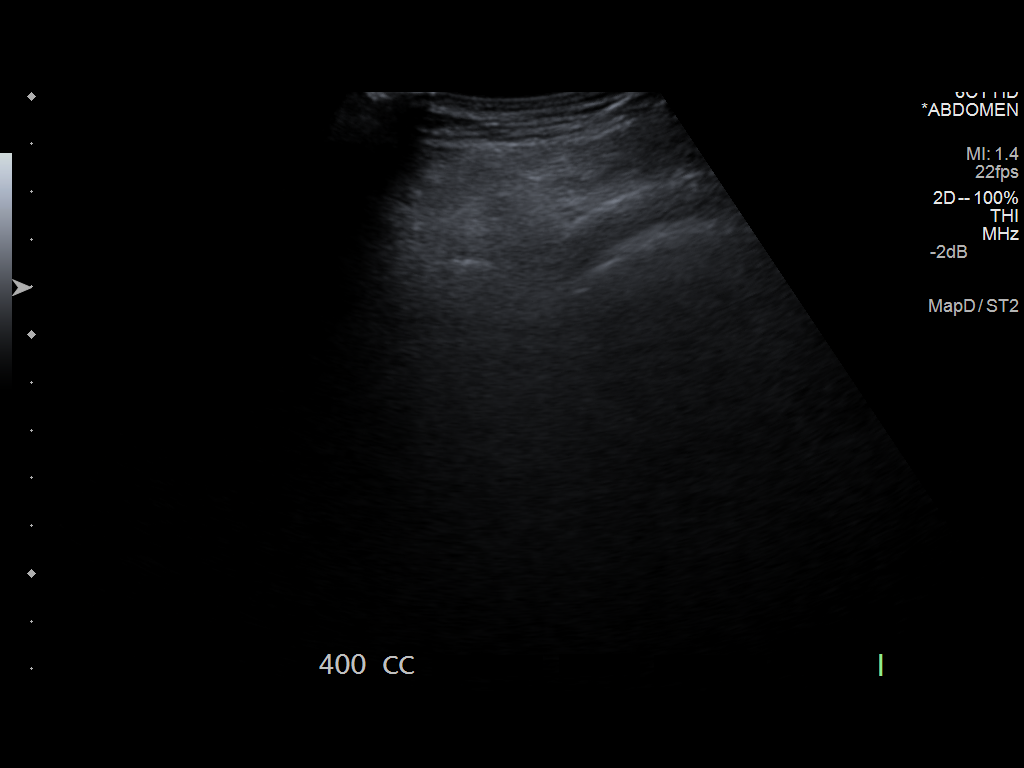

[7 of 7 positions shown; findings below may reference images not displayed]

FINDINGS: A total of approximately 400 cc of clear pleural fluid was removed.
A fluid sample wassent for laboratory analysis.
IMPRESSION: Successful ultrasound guided right thoracentesis yielding 400 cc of
pleural fluid.

PROCEDURE:
An ultrasound guided thoracentesis was thoroughly discussed with the
patient and questions answered. The benefits, risks, alternatives
and complications were also discussed. The patient understands and
wishes to proceed with the procedure. Written consent was obtained.

Ultrasound was performed to localize and mark an adequate pocket of
fluid in the right posterior chest. The area was then prepped and
draped in the normal sterile fashion. 1% Lidocaine was used for
local anesthesia. Under ultrasound guidance a 19 gauge Yueh catheter
was introduced. Thoracentesis was performed. The catheter was
removed and a dressing applied.

Complications:  No immediate

## 2015-04-01 NOTE — Op Note (Signed)
PATIENT NAME:  Stacy Pittman, Stacy Pittman MR#:  [id number] DATE OF BIRTH:  1932-05-17  DATE OF PROCEDURE: 11/28/2012.   PREOPERATIVE DIAGNOSIS: Visually significant cataract of the left eye.   POSTOPERATIVE DIAGNOSIS: Visually significant cataract of the left eye.   OPERATIVE PROCEDURE: Cataract extraction by phacoemulsification with implant of intraocular lens to left eye.   SURGEON: Birder Robson, MD.   ANESTHESIA:  1. Managed anesthesia care.  2. Topical tetracaine drops followed by 2% Xylocaine jelly applied in the preoperative holding area.   COMPLICATIONS: None.   TECHNIQUE:  Stop and chop.   DESCRIPTION OF PROCEDURE: The patient was examined and consented in the preoperative holding area where the aforementioned topical anesthesia was applied to the left eye and then brought back to the operating room where the left eye was prepped and draped in the usual sterile ophthalmic fashion and a lid speculum was placed. A paracentesis was created with the side port blade and the anterior chamber was filled with viscoelastic. A near clear corneal incision was performed with the steel keratome. A continuous curvilinear capsulorrhexis was performed with a cystotome followed by the capsulorrhexis forceps. Hydrodissection and hydrodelineation were carried out with BSS on a blunt cannula. The lens was removed in a stop and chop technique and the remaining cortical material was removed with the irrigation-aspiration handpiece. The capsular bag was inflated with viscoelastic and the Tecnis ZCB00 22.5-diopter lens, serial number [id number] was placed in the capsular bag without complication. The remaining viscoelastic was removed from the eye with the irrigation-aspiration handpiece. The wounds were hydrated. The anterior chamber was flushed with Miostat and the eye was inflated to physiologic pressure. The wounds were found to be water tight. The eye was dressed with Vigamox. The patient was given protective  glasses to wear throughout the day and a shield with which to sleep tonight. The patient was also given drops with which to begin a drop regimen today and will follow up with me in 1 day.    ____________________________ Livingston Diones. Maxene Byington, MD wlp:gb D: 11/28/2012 65:46:50 ET T: 11/28/2012 23:17:44 ET JOB#: [id number]  cc: Aleksandar Duve L. Juanetta Negash, MD, <Dictator> Livingston Diones Jaqlyn Gruenhagen MD ELECTRONICALLY SIGNED 12/01/2012 10:51

## 2015-04-01 NOTE — Op Note (Signed)
PATIENT NAME:  Stacy Pittman, Stacy Pittman MR#:  [id number] DATE OF BIRTH:  18-Aug-1932  DATE OF PROCEDURE:  10/30/2012  PREOPERATIVE DIAGNOSIS: Visually significant cataract of the right eye.   POSTOPERATIVE DIAGNOSIS: Visually significant cataract of the right eye.   OPERATIVE PROCEDURE: Cataract extraction by phacoemulsification with implant of intraocular lens to right eye.   SURGEON: Birder Robson, MD.   ANESTHESIA:  1. Managed anesthesia care.  2. Topical tetracaine drops followed by 2% Xylocaine jelly applied in the preoperative holding area.   COMPLICATIONS: None.   TECHNIQUE:  Stop-and-chop    DESCRIPTION OF PROCEDURE: The patient was examined and consented in the preoperative holding area where the aforementioned topical anesthesia was applied to the right eye and then brought back to the Operating Room where the right eye was prepped and draped in the usual sterile ophthalmic fashion and a lid speculum was placed. A paracentesis was created with the side port blade and the anterior chamber was filled with viscoelastic. A near clear corneal incision was performed with the steel keratome. A continuous curvilinear capsulorrhexis was performed with a cystotome followed by the capsulorrhexis forceps. Hydrodissection and hydrodelineation were carried out with BSS on a blunt cannula. The lens was removed in a stop-and-chop technique and the remaining cortical material was removed with the irrigation-aspiration handpiece. The capsular bag was inflated with viscoelastic and the Tecnis ZCB00 22.5-diopter lens, serial number [id number] was placed in the capsular bag without complication. The remaining viscoelastic was removed from the eye with the irrigation-aspiration handpiece. The wounds were hydrated. The anterior chamber was flushed with Miostat and the eye was inflated to physiologic pressure. The wounds were found to be water tight. The eye was dressed with Vigamox. The patient was given protective  glasses to wear throughout the day and a shield with which to sleep tonight. The patient was also given drops with which to begin a drop regimen today and will follow-up with me in one day.   ____________________________ Livingston Diones. Shriyans Kuenzi, MD wlp:drc D: 10/30/2012 17:31:32 ET T: 10/31/2012 08:14:21 ET JOB#: [id number]  cc: Josslin Sanjuan L. Alexzavier Girardin, MD, <Dictator> Livingston Diones Locklyn Henriquez MD ELECTRONICALLY SIGNED 11/16/2012 15:46

## 2015-04-04 NOTE — Op Note (Signed)
PATIENT NAME:  Stacy Pittman, Stacy Pittman MR#:  [id number] DATE OF BIRTH:  February 18, 1932  DATE OF PROCEDURE:  05/04/2013  PREOPERATIVE DIAGNOSIS:  Right leg hematoma after trauma.   POSTOPERATIVE DIAGNOSIS:   Right leg hematoma after trauma.  PROCEDURE PERFORMED:  Incision and drainage of right leg hematoma, with excision of necrotic skin, 8 x 5 cm and 1 x 1 cm.   ANESTHESIA:  MAC, LMA.  ESTIMATED BLOOD LOSS:  Minimal.  COMPLICATIONS:  None.   SPECIMEN:  Clot for culture and sensitivity.   INDICATION FOR SURGERY:  [name] is a pleasant, 81 year old female who presents with a trauma to her right leg x 4 days. It was extremely tense, and there was dark necrotic blood underneath, and I thought the skin was necrotic. Was brought to the Operating Room, as she is on Coumadin, for incision and drainage of wound with debridement of necrotic tissue.   DETAILS OF PROCEDURE:  Informed consent was obtained. [name] was brought to the Operating Room suite. She was laid supine on the Operating Room table. She was induced, LMA was placed, general anesthesia was administered. Her right leg was then prepped and draped in standard surgical fashion. A timeout was then performed, correctly identifying the patient name, operative site and procedure to be performed. An incision was made through the middle of the lateralmost hematoma. This was deepened down to the clot. The skin was very thin and in need of debridement. Therefore, I debrided all necrotic tissue and cleaned the clot out. The total area of necrotic tissue was 8 x 5 cm and 1 x 1 cm. This was brought down to the subcu tissue sharply. I then used Bovie electrocautery to provide hemostasis to the wound. The wound was hemostatic, and then I placed a Betadine-soaked gauze over the wound, and placed an ABD pad and a Kerlix gauze over the wound. A dressing was then placed over the wound. The patient was then awoken, extubated, and brought to the postanesthesia care  unit. There were no immediate complications. Needle, sponge and instrument count was correct at the end of the procedure.    ____________________________ Glena Norfolk. Ladene Allocca, MD cal:mr D: 05/04/2013 17:03:04 ET T: 05/04/2013 21:53:16 ET JOB#: [id number]  cc: Harrell Gave A. Lyfe Reihl, MD, <Dictator> Floyde Parkins MD ELECTRONICALLY SIGNED 05/07/2013 18:46

## 2015-04-04 NOTE — Consult Note (Signed)
PATIENT NAME:  Stacy Pittman, Stacy Pittman MR#:  [id number] DATE OF BIRTH:  [birth year]  DATE OF CONSULTATION:  12/05/2013  REFERRING PHYSICIAN:   CONSULTING PHYSICIAN:  Isaias Cowman, MD  REASON FOR CONSULTATION: Congestive heart failure, atrial fibrillation and borderline elevated troponin.   CHIEF COMPLAINT: Shortness of breath and cough.   PRIMARY CARE PHYSICIAN: Dr. Venia Minks.  CARDIOLOGIST: Dr. Clayborn Bigness.   HISTORY OF PRESENT ILLNESS: The patient is a 81 year old female with history of COPD, diastolic congestive heart failure and atrial fibrillation. The patient presents with a 3-day history of increasing shortness of breath, productive cough and wheezing, admitted with diagnosis of pneumonia. Admission labs were notable for borderline elevated troponin of 0.11 with followup of 0.1. The patient is in chronic atrial fibrillation with a controlled ventricular rate. The patient denies chest pain.   PAST MEDICAL HISTORY:  1.  Atrial fibrillation.  2.  Diastolic congestive heart failure.  3.  Chronic obstructive pulmonary disease.  4.  Hypertension.  5.  Hyperlipidemia.  6.  Prior history of pneumonia. 7.  Melanoma of the right cheek, status post radiation therapy.   MEDICATIONS ON ADMISSION: Amiodarone 200 mg daily, atorvastatin 10 mg daily, Coumadin 1 mg every other day, potassium 10 mEq daily and Coumadin 1 mg Monday, Wednesday, Friday and 1.5 mg Tuesday, Thursday, Saturday, Sunday.   SOCIAL HISTORY: The patient is a widow. She lives with her son. She quit tobacco abuse in 2009.   FAMILY HISTORY: No immediate family history of coronary artery disease or myocardial infarction.   REVIEW OF SYSTEMS: CONSTITUTIONAL: The patient has had mild fever and chills. EYES: No blurry vision. EARS: No hearing loss. RESPIRATORY: The patient has shortness of breath, wheezing and productive cough. CARDIOVASCULAR: The patient denies chest pain. GASTROINTESTINAL: She has had some mild nausea. GENITOURINARY: No  dysuria or hematuria. ENDOCRINE: No polyuria or polydipsia. MUSCULOSKELETAL: No arthralgias or myalgias. NEUROLOGICAL: No focal muscle weakness no numbness. PSYCHOLOGICAL: No depression or anxiety.   PHYSICAL EXAMINATION:  VITAL SIGNS: Blood pressure is 118/69, pulse 91, respirations 20, temperature 98.6, pulse oximetry 91%.  HEENT: Pupils equal, reactive to light and accommodation.  NECK: Supple without thyromegaly.  LUNGS: Decreased breath sounds in both bases.  HEART: Normal JVP. Normal PMI. Regular rate and rhythm. Normal S1, S2. No appreciable gallop, murmur, or rub.  ABDOMEN: Soft and nontender. Pulses were intact bilaterally.  MUSCULOSKELETAL: Normal muscle tone.  NEUROLOGIC: The patient is alert and oriented x 3. Motor and sensory both grossly intact.   IMPRESSION: This is a 81 year old female, who presents with chronic obstructive pulmonary disease flare and pneumonia. The patient has chronic atrial fibrillation with a controlled ventricular rate. The patient had borderline elevated troponin, which is likely demand/supply ischemia and unlikely due to acute coronary syndrome. The patient likely has had some mild element of diastolic congestive heart failure, which appears to be stable and under control. The patient appears to be clinically improving on antibiotic therapy.   RECOMMENDATIONS:  1.  I agree with overall current therapy.  2.  Would defer full dose anticoagulation.  3.  Continue furosemide 20 mg b.i.d.  4.  Continue warfarin for stroke prevention.  5.  Would defer further cardiac diagnostics at this time.  ____________________________ Isaias Cowman, MD ap:aw D: 12/05/2013 12:55:47 ET T: 12/05/2013 13:17:41 ET JOB#: [id number]  cc: Isaias Cowman, MD, <Dictator> Isaias Cowman MD ELECTRONICALLY SIGNED 12/12/2013 8:41

## 2015-04-05 NOTE — Discharge Summary (Signed)
PATIENT NAME:  Stacy Pittman, Stacy Pittman MR#:  [id number] DATE OF BIRTH:  02/07/1932  DATE OF ADMISSION:  11/29/2013 DATE OF DISCHARGE:  12/18/2013  For a detailed note, please take a look at the history and physical done on admission by Dr. Ether Griffins. Please also look at the interim discharge summary done by Dr. Ether Griffins which covers the extensive part of the hospital course from December 18th until December 14, 2013. This is just a short interim course from January 3rd until January 6th.   DIAGNOSES UPON DISCHARGE: As follows:  Acute on chronic respiratory failure.   Systemic inflammatory response syndrome. Bilateral pneumonia. Chronic obstructive pulmonary disease exacerbation. Elevated troponin. Chronic atrial fibrillation. Acute on chronic diastolic congestive heart failure. Pleural effusions, likely secondary to diastolic dysfunction. Dysphagia.   DISPOSITION:  The patient is being discharged to a skilled nursing facility.   DIET;  The patient is being discharged on a low sodium, low fat diet, mechanical soft with honey-thick liquids and dysphagia 3 chopped diet.   ACTIVITIES: As tolerated.   FOLLOWUP: Dr. Margarita Rana in the next 1 to 2 weeks.   DISCHARGE MEDICATIONS: As follows: Amiodarone 200 mg daily, Remeron 15 mg at bedtime, Neurontin 300 mg t.i.d., Xanax 0.5 mg t.i.d. as needed, atorvastatin 10 mg daily, potassium 10 mEq daily, albuterol inhaler 2 puffs q.4 to 6 hours as needed. Warfarin 1 mg on Sunday Tuesday, Thursday, Saturday and 1.5 mg on Monday, Wednesday, Friday. Lovenox 70 subcutaneously q.12 hours, this is going to be continued until the patient's INR is therapeutic between 2 and 3. Spiriva 1 puff daily, Advair 250/50 one puff b.i.d., Cardizem CD 120 mg daily, Lasix 20 mg daily, metoprolol tartrate 50 mg b.i.d., guaifenesin 600 mg b.i.d.   CONSULTANTS DURING THE HOSPITAL COURSE: Dr. Raul Del from pulmonary critical care, Dr. Izora Gala [facility] palliative care.   PERTINENT STUDIES DONE DURING  THE INTERIM HOSPITAL COURSE: Are as follows: A chest x-ray done on 12/14/2013 showing continued bibasilar opacification, likely small to moderate bilateral effusions associated with atelectasis. An ultrasound-guided thoracentesis done on 12/17/2013 where 400 mL of clear pleural fluid was removed. The patient's post-thoracentesis chest x-ray showed no evidence of pneumothorax, bilateral airspace process worse in the right upper lobe and dense left base consolidation.   HOSPITAL COURSE: This is a 82 year old female with multiple medical problems as mentioned above, presented to the hospital due to shortness of breath and noted to be in acute on chronic respiratory failure.  1.  Acute on chronic respiratory failure. This was likely secondary to chronic aspiration pneumonia. The patient has been treated aggressively in the hospital with IV steroids and has already finished a prednisone taper and is currently on maintenance prednisone. She is also getting p.r.n. nebulizer treatments. The patient's O2 sats are stable just on nasal cannula oxygen at 2 to 3 liters. She is, therefore, being discharged further to the skilled nursing facility. She has been seen by speech and had a modified barium swallow and they advised honey-thick liquids with chopped meats, which she is currently tolerating well. The patient was also noted to have bilateral pleural effusions and, given her recent aspiration pneumonia, she underwent a diagnostic thoracentesis on 12/17/2013, although the fluid was consistent with a transudative infusion and likely related to the diastolic CHF.  The patient will continue Lasix for her CHF as mentioned.  2.  Pneumonia. This was likely aspiration pneumonia. The patient has finished antibiotic therapy with Levaquin and ertapenem. Her blood cultures have remained negative. Her sputum cultures  just grew out yeast. As mentioned, she has been evaluated by speech and had a modified barium  which advised to her  being on a honey-thick liquid with chopped meat diet which she is currently tolerating well. There was some concern for possible underlying empyema although the patient's thoracentesis done yesterday and the fluid analysis was consistent with a transudate infusion instead of an exudative infusion. She is currently afebrile and hemodynamically stable.  3.  COPD.  The patient did have a mild COPD exacerbation although she is currently significantly improved. She has no bronchospasm. She continues to have a bit of a cough. For now, she will continue maintenance Advair, Spiriva and DuoNebs as needed, and continue maintenance prednisone at 20 mg daily.  4.  Elevated troponin. This was likely secondary to supply-demand ischemia from her A. fib and hypoxemia from her respiratory failure. There was no evidence of acute coronary syndrome. The patient's echocardiogram did not show any evidence of wall motion abnormalities. She is currently chest pain-free and hemodynamically stable.  5.  Chronic atrial fibrillation. During her acute respiratory illness, she did have some rapid ventricular response although that has improved. The patient presently will continue her maintenance meds for A. fib including amiodarone, metoprolol, Cardizem. Her Coumadin was reversed as she was to have a thoracentesis although post procedure she is now resuming her Coumadin and I am putting her on a Lovenox bridge until the INR becomes therapeutic between 2 and 3. 6.  Acute on chronic diastolic congestive heart failure. Clinically, patient does not appear to be in CHF presently. She will continue her maintenance Lasix and beta blockers as stated.  7.  Hypertension. The patient remained hemodynamically stable on Lopressor and Cardizem. She will continue that upon discharge.  8.  CODE STATUS: The patient is a DO NOT INTUBATE/DO NOT RESUSCITATE. She is being discharged to a skilled nursing facility for ongoing care.   TIME SPENT: 45 minutes.    ____________________________ Belia Heman. Verdell Carmine, MD vjs:cs D: 12/18/2013 15:12:44 ET T: 12/18/2013 15:36:41 ET JOB#: [id number]  cc: Belia Heman. Verdell Carmine, MD, <Dictator> Henreitta Leber MD ELECTRONICALLY SIGNED 01/09/2014 11:21

## 2015-04-05 NOTE — H&P (Signed)
PATIENT NAME:  Stacy Pittman, Stacy Pittman MR#:  [id number] DATE OF BIRTH:  12-Aug-1932  DATE OF ADMISSION:  11/29/2013  PRIMARY CARE PHYSICIAN:  Dr. Venia Minks.  The patient is a 81 year old Caucasian female with past medical history significant for history of chronic respiratory failure on oxygen therapy at 2 liters of oxygen per nasal cannula as needed; history of CHF, COPD, atrial fibrillation, on Coumadin therapy for this, presents to the hospital with complaints of 3 day history of coughing with white phlegm, as well as thick phlegm, also fevers and chills. She has been also more short of breath, as well as wheezing. She has been lightheaded and dizzy and felt presyncopal earlier today. She almost fell down today. She also is admitting palpitations. She was so terribly weak that her son decided to bring her to the hospital for further evaluation. In the hospital, she was noted to have pneumonia and hospitalist services were contacted for admission.   PAST MEDICAL HISTORY: Significant for history of chronic Afib, on Coumadin therapy for that; history of COPD, CHF, history of hypertension, anxiety, hyperlipidemia, history of chronic respiratory failure on 2 liters of oxygen per nasal cannula as needed. Myoview in July 2012 was negative with ejection fraction of 49%. History of admission for syncope in May 2012, history of prolonged stay in the hospital in 2009 for pneumonia, anxiety, history of melanoma of right cheek, status post radiation, as well as resection, and hypertension.   PAST SURGICAL HISTORY: Melanoma resection and lymph node dissection, also tubal ligation, carpal tunnel procedure on the right upper extremity, appendectomy.   ALLERGIES: No known drug allergies.   MEDICATIONS: Medication list not fully available; however, according to medical records, at least whatever we were able to get out are as follows: Alprazolam 0.5 mg 3 times daily as needed, amiodarone 200 mg p.o. daily, atorvastatin 10 mg  p.o. daily, Coumadin 1 mg p.o. every second day, potassium tablets 10 mEq once daily, Remeron 50 mg p.o. at bedtime, Neurontin 300 mg 3 times daily and ProAir HFA every 4 to 6 hours as needed. It is unclear if she is still taking medications; however, the patient is disputing her Coumadin dose. She tells me that she is taking 1 mg only on Mondays, Wednesdays, Fridays, and the other days she takes 1.5 mg.   SOCIAL HISTORY: Lives in [place].  Her son lives with her.  The patient was a heavy smoker, quit in 2633 after complicated hospital course. She drinks beer intermittently. Used to work in a factory.   FAMILY HISTORY: Brother had stroke. The patient's mother had stroke. Family history of hypertension, as well as cancer.   REVIEW OF SYSTEMS:  CONSTITUTIONAL:  Fatigue and weakness, fevers and chills. No weight loss or gain.  EYES: Denies any blurry vision, double vision, glaucoma or cataracts.  ENT: Denies any tinnitus, allergies, epistaxis, sinus pain, dentures, or difficulty breathing or swallowing.  The patient admits to having some sinus congestion over the past few days. RESPIRATORY: Denies cough, wheezes, asthma, COPD.  CARDIOVASCULAR: Denies any chest pains. Admits to palpitations. Feels presyncopal earlier today and weak, although no loss of consciousness was noted.  ABDOMEN:  Denies any nausea, vomiting, diarrhea or constipation, abdominal pain.  GENITOURINARY: Denies dysuria, hematuria, frequency or incontinence.  ENDOCRINE:  Denies any polydipsia, nocturia, thyroid problems, heat or cold intolerance or thirst.  HEMATOLOGIC: Denies anemia, easy bruising, bleeding. No clots.  SKIN: Denies any acne, rashes, lesions or moles.  MUSCULOSKELETAL: Denies arthritis, cramps or swelling.  NEUROLOGIC: No numbness, epilepsy or tremor.  PSYCHIATRIC: Denies anxiety, insomnia or depression.   PHYSICAL EXAMINATION: VITAL SIGNS: On arrival to the hospital, the patient's temperature was 104.5,  pulse 122, respirations 22, blood pressure 120/68, saturation was 89% on 4 liters of oxygen per nasal cannula.  GENERAL: This is a well-developed, pale Caucasian female, somewhat weak and slumped and laying on the stretcher.  HEENT: Her pupils are equal, reactive to light. Extraocular movements are intact. No icterus or conjunctivitis.  Normal hearing. No pharyngeal erythema. Mucosa is moist.  NECK: No masses. Supple and nontender.  Thyroid is not enlarged. No adenopathy. No JVD or carotid bruits bilaterally. She full range of motion.  LUNGS: Crackles, as well as diminished breath sounds were noted, bilateral rales, as well as crackles. No significant wheezing, but diminished breath sounds were noted. The patient does not use any accessory muscles, though admits to respiratory effort at this time. However, she does cough, and then she would gasp because of rattling in the chest.   ABDOMEN: Soft, nontender. Bowel sounds are normal. No hepatosplenomegaly or masses were noted.  RECTAL: Deferred.  MUSCLE STRENGTH: Able to move all extremities. No cyanosis, degenerative joint disease or kyphosis. Gait is not tested.  SKIN: No rashes, lesions, erythema, nodularity or induration. It was warm and dry to palpation. The patient does have an indentation on her right cheek area, which shows chronic scarring due to a melanoma excision, as well as radiation therapy. No other rashes, lesions, ulcerations were noted.  LYMPHATIC: No adenopathy in the cervical region.  NEUROLOGICAL: Cranial nerves grossly intact. Sensory is intact. No dysarthria or aphasia.  The patient is alert and oriented to time, person and place, cooperative. Memory is good. No significant confusion, agitation or depression was noted.   LABORATORY DATA:  BMP showed elevation of creatinine of 1.33, glucose 147, otherwise BMP was unremarkable. The patient's liver enzymes: Albumin level of 3.1. AST as well as ALT were 193 and 99 respectively. Troponin  was elevated at 0.06. TSH was normal at 0.45. Elevated white blood cell count to 14.9, hemoglobin 11.7, platelet count was not reported; however, MCV was low at 74. Absolute neutrophil count was high at 12.8. Coagulation panel: Pro time was 16.3 and INR was 1.3 only.  Urinalysis: Yellow, clear urine. Negative for glucose, bilirubin or ketones. Specific gravity 1.012; pH was 5.0, negative for blood, protein, nitrites or leukocyte esterase. No red blood cells, less than 1 white blood cell, trace bacteria, no epithelial cells. Mucus was present, as well as some hyaline casts. ABGs were done on 36% FiO2. PH was 7.35. PCO2 was 41. PO2 was 72. Saturation was 92.7% on 36% FiO2. EKG showed Afib at a rate of 115 beats per minute, left axis deviation, incomplete right bundle branch block, nonspecific ST-T changes. The patient's portable single view chest x-ray revealed atelectasis versus infiltrate in left lung base, possibly small left effusion as well.   ASSESSMENT AND PLAN: 1.  Acute on chronic respiratory failure. Admit the patient to the medical floor. I started her on antibiotic therapy. Get blood cultures, as well as sputum cultures. We will continue DuoNeb, as well as follow the patient clinically.  2.  History of pneumonia. Will get, as mentioned above, blood culture as well as sputum culture and adjust antibiotics according to culture results.  3.  Chronic obstructive pulmonary disease exacerbation. We will start the patient on steroids IV, DuoNebs, as well as Advair Diskus.  4.  Renal insufficiency. We will  continue the patient on low rate IV fluids. We will follow the patient's creatinine in the morning.  5.  Leukocytosis.  We will follow with antibiotic therapy. 5.  Elevated troponin.  We will initiate the patient on low-dose beta blocker, as well as nitroglycerin, as long as the patient's blood pressure tolerates. We will also start the patient on aspirin therapy.  6.  Atrial fib, rapid ventricular  response. We will hold amiodarone, as the patient's liver enzymes were up. We will also initiate metoprolol for now. It is very likely the patient's heart rate is elevated due to high fevers to 104. We will try to control her fever. We will also give fluids due to mild dehydration. We will check cardiac enzymes x 3.  7.  Elevated transaminases, likely amiodarone related; however, cannot rule out alcoholic liver disease.  We will get ultrasound in the morning. The patient does not seem to be symptomatic.   TIME SPENT: 50 minutes on this patient.    ____________________________ Theodoro Grist, MD rv:dmm D: 11/29/2013 20:06:00 ET T: 11/29/2013 20:38:08 ET JOB#: [id number]  cc: Jerrell Belfast, MD Theodoro Grist, MD, <Dictator> [place] MD ELECTRONICALLY SIGNED 01/04/2014 13:37
